# Patient Record
Sex: Male | Born: 1945
Health system: Southern US, Community
[De-identification: ages and names within clinical notes are randomized; demographics above are authoritative.]

## PROBLEM LIST (undated history)

## (undated) DIAGNOSIS — I4892 Unspecified atrial flutter: Secondary | ICD-10-CM

## (undated) DIAGNOSIS — I4891 Unspecified atrial fibrillation: Secondary | ICD-10-CM

## (undated) DIAGNOSIS — K219 Gastro-esophageal reflux disease without esophagitis: Secondary | ICD-10-CM

## (undated) DIAGNOSIS — M519 Unspecified thoracic, thoracolumbar and lumbosacral intervertebral disc disorder: Secondary | ICD-10-CM

## (undated) DIAGNOSIS — R55 Syncope and collapse: Secondary | ICD-10-CM

## (undated) DIAGNOSIS — M1990 Unspecified osteoarthritis, unspecified site: Secondary | ICD-10-CM

## (undated) DIAGNOSIS — C61 Malignant neoplasm of prostate: Secondary | ICD-10-CM

## (undated) DIAGNOSIS — C7951 Secondary malignant neoplasm of bone: Secondary | ICD-10-CM

## (undated) DIAGNOSIS — I499 Cardiac arrhythmia, unspecified: Secondary | ICD-10-CM

## (undated) HISTORY — PX: LUMBAR LAMINECTOMY: SHX95

## (undated) HISTORY — DX: Unspecified osteoarthritis, unspecified site: M19.90

## (undated) HISTORY — PX: LAPAROSCOPIC CHOLECYSTECTOMY: SUR755

## (undated) HISTORY — PX: APPENDECTOMY: SHX54

## (undated) HISTORY — PX: KNEE SURGERY: SHX244

## (undated) HISTORY — DX: Malignant neoplasm of prostate: C61

## (undated) HISTORY — DX: Secondary malignant neoplasm of bone: C79.51

## (undated) HISTORY — PX: BACK SURGERY: SHX140

## (undated) HISTORY — DX: Unspecified atrial flutter: I48.92

## (undated) HISTORY — DX: Unspecified atrial fibrillation: I48.91

---

## 1998-09-03 ENCOUNTER — Ambulatory Visit (HOSPITAL_COMMUNITY): Admission: RE | Admit: 1998-09-03 | Discharge: 1998-09-03 | Payer: Self-pay | Admitting: Orthopedic Surgery

## 1999-06-23 ENCOUNTER — Ambulatory Visit (HOSPITAL_COMMUNITY): Admission: RE | Admit: 1999-06-23 | Discharge: 1999-06-23 | Payer: Self-pay | Admitting: Gastroenterology

## 2000-04-18 ENCOUNTER — Encounter: Payer: Self-pay | Admitting: Emergency Medicine

## 2000-04-18 ENCOUNTER — Emergency Department (HOSPITAL_COMMUNITY): Admission: EM | Admit: 2000-04-18 | Discharge: 2000-04-18 | Payer: Self-pay | Admitting: Emergency Medicine

## 2000-04-19 ENCOUNTER — Encounter: Payer: Self-pay | Admitting: Emergency Medicine

## 2000-04-19 ENCOUNTER — Encounter: Payer: Self-pay | Admitting: Surgery

## 2000-04-19 ENCOUNTER — Observation Stay: Admission: AD | Admit: 2000-04-19 | Discharge: 2000-04-20 | Payer: Self-pay | Admitting: Surgery

## 2002-06-10 ENCOUNTER — Ambulatory Visit (HOSPITAL_COMMUNITY): Admission: RE | Admit: 2002-06-10 | Discharge: 2002-06-10 | Payer: Self-pay | Admitting: Gastroenterology

## 2003-05-09 ENCOUNTER — Encounter: Admission: RE | Admit: 2003-05-09 | Discharge: 2003-05-09 | Payer: Self-pay | Admitting: Neurology

## 2004-10-27 ENCOUNTER — Encounter: Admission: RE | Admit: 2004-10-27 | Discharge: 2004-10-27 | Payer: Self-pay | Admitting: Family Medicine

## 2004-12-27 ENCOUNTER — Ambulatory Visit (HOSPITAL_COMMUNITY): Admission: RE | Admit: 2004-12-27 | Discharge: 2004-12-27 | Payer: Self-pay | Admitting: General Surgery

## 2004-12-27 ENCOUNTER — Ambulatory Visit (HOSPITAL_BASED_OUTPATIENT_CLINIC_OR_DEPARTMENT_OTHER): Admission: RE | Admit: 2004-12-27 | Discharge: 2004-12-27 | Payer: Self-pay | Admitting: General Surgery

## 2010-07-04 DIAGNOSIS — R55 Syncope and collapse: Secondary | ICD-10-CM

## 2010-07-04 HISTORY — DX: Syncope and collapse: R55

## 2010-09-07 ENCOUNTER — Emergency Department (HOSPITAL_COMMUNITY)
Admission: EM | Admit: 2010-09-07 | Discharge: 2010-09-07 | Disposition: A | Discharge status: Home / Self Care | Payer: BC Managed Care – PPO | Attending: Emergency Medicine | Admitting: Emergency Medicine

## 2010-09-07 DIAGNOSIS — H538 Other visual disturbances: Secondary | ICD-10-CM | POA: Insufficient documentation

## 2010-09-07 DIAGNOSIS — H3320 Serous retinal detachment, unspecified eye: Secondary | ICD-10-CM | POA: Insufficient documentation

## 2010-09-07 DIAGNOSIS — H4310 Vitreous hemorrhage, unspecified eye: Secondary | ICD-10-CM | POA: Insufficient documentation

## 2010-09-07 DIAGNOSIS — H532 Diplopia: Secondary | ICD-10-CM | POA: Insufficient documentation

## 2011-08-30 DIAGNOSIS — Z85820 Personal history of malignant melanoma of skin: Secondary | ICD-10-CM | POA: Diagnosis not present

## 2011-08-30 DIAGNOSIS — L570 Actinic keratosis: Secondary | ICD-10-CM | POA: Diagnosis not present

## 2011-08-30 DIAGNOSIS — D235 Other benign neoplasm of skin of trunk: Secondary | ICD-10-CM | POA: Diagnosis not present

## 2011-09-02 DIAGNOSIS — I4892 Unspecified atrial flutter: Secondary | ICD-10-CM

## 2011-09-02 HISTORY — DX: Unspecified atrial flutter: I48.92

## 2011-09-21 ENCOUNTER — Encounter (HOSPITAL_COMMUNITY): Payer: Self-pay | Admitting: *Deleted

## 2011-09-21 ENCOUNTER — Inpatient Hospital Stay (HOSPITAL_COMMUNITY)
Admission: EM | Admit: 2011-09-21 | Discharge: 2011-09-22 | DRG: 310 | Disposition: A | Discharge status: Home / Self Care | Payer: Medicare Other | Attending: Cardiology | Admitting: Cardiology

## 2011-09-21 ENCOUNTER — Emergency Department (HOSPITAL_COMMUNITY): Payer: Medicare Other

## 2011-09-21 DIAGNOSIS — Z7982 Long term (current) use of aspirin: Secondary | ICD-10-CM

## 2011-09-21 DIAGNOSIS — R351 Nocturia: Secondary | ICD-10-CM | POA: Diagnosis present

## 2011-09-21 DIAGNOSIS — M47817 Spondylosis without myelopathy or radiculopathy, lumbosacral region: Secondary | ICD-10-CM | POA: Diagnosis present

## 2011-09-21 DIAGNOSIS — Z6831 Body mass index (BMI) 31.0-31.9, adult: Secondary | ICD-10-CM | POA: Diagnosis not present

## 2011-09-21 DIAGNOSIS — R3911 Hesitancy of micturition: Secondary | ICD-10-CM | POA: Diagnosis present

## 2011-09-21 DIAGNOSIS — R0602 Shortness of breath: Secondary | ICD-10-CM | POA: Diagnosis not present

## 2011-09-21 DIAGNOSIS — Z79899 Other long term (current) drug therapy: Secondary | ICD-10-CM | POA: Diagnosis not present

## 2011-09-21 DIAGNOSIS — R000 Tachycardia, unspecified: Secondary | ICD-10-CM | POA: Diagnosis not present

## 2011-09-21 DIAGNOSIS — M1710 Unilateral primary osteoarthritis, unspecified knee: Secondary | ICD-10-CM | POA: Diagnosis present

## 2011-09-21 DIAGNOSIS — I441 Atrioventricular block, second degree: Secondary | ICD-10-CM | POA: Diagnosis present

## 2011-09-21 DIAGNOSIS — M519 Unspecified thoracic, thoracolumbar and lumbosacral intervertebral disc disorder: Secondary | ICD-10-CM | POA: Insufficient documentation

## 2011-09-21 DIAGNOSIS — R55 Syncope and collapse: Secondary | ICD-10-CM | POA: Insufficient documentation

## 2011-09-21 DIAGNOSIS — I4892 Unspecified atrial flutter: Principal | ICD-10-CM | POA: Diagnosis present

## 2011-09-21 DIAGNOSIS — R002 Palpitations: Secondary | ICD-10-CM | POA: Diagnosis not present

## 2011-09-21 HISTORY — DX: Unspecified thoracic, thoracolumbar and lumbosacral intervertebral disc disorder: M51.9

## 2011-09-21 HISTORY — DX: Syncope and collapse: R55

## 2011-09-21 LAB — CBC
HCT: 48.5 % (ref 39.0–52.0)
MCV: 92.4 fL (ref 78.0–100.0)
Platelets: 202 10*3/uL (ref 150–400)
RDW: 13.8 % (ref 11.5–15.5)
RDW: 13.8 % (ref 11.5–15.5)
WBC: 7.5 10*3/uL (ref 4.0–10.5)
WBC: 6.8 10*3/uL (ref 4.0–10.5)

## 2011-09-21 LAB — TSH: TSH: 1.158 u[IU]/mL (ref 0.350–4.500)

## 2011-09-21 LAB — COMPREHENSIVE METABOLIC PANEL
BUN: 21 mg/dL (ref 6–23)
CO2: 23 mEq/L (ref 19–32)
Chloride: 109 mEq/L (ref 96–112)
Creatinine, Ser: 0.92 mg/dL (ref 0.50–1.35)
GFR calc Af Amer: >90 mL/min (ref >90)
GFR calc non Af Amer: 85 mL/min — ABNORMAL LOW (ref >90)
Total Bilirubin: 0.6 mg/dL (ref 0.3–1.2)

## 2011-09-21 LAB — DIFFERENTIAL
Basophils Absolute: 0 10*3/uL (ref 0.0–0.1)
Eosinophils Relative: 3 % (ref 0–5)
Lymphs Abs: 1.9 10*3/uL (ref 0.7–4.0)
Monocytes Relative: 7 % (ref 3–12)
Neutro Abs: 4.9 10*3/uL (ref 1.7–7.7)

## 2011-09-21 LAB — CARDIAC PANEL(CRET KIN+CKTOT+MB+TROPI)
CK, MB: 4.1 ng/mL — ABNORMAL HIGH (ref 0.3–4.0)
Relative Index: 4 — ABNORMAL HIGH (ref 0.0–2.5)
Total CK: 103 U/L (ref 7–232)
Troponin I: <0.3 ng/mL

## 2011-09-21 LAB — TROPONIN I: Troponin I: <0.3 ng/mL (ref <0.30)

## 2011-09-21 LAB — CREATININE, SERUM
GFR calc Af Amer: >90 mL/min (ref >90)
GFR calc non Af Amer: 83 mL/min — ABNORMAL LOW (ref >90)

## 2011-09-21 LAB — PROTIME-INR: Prothrombin Time: 15 seconds (ref 11.6–15.2)

## 2011-09-21 LAB — APTT: aPTT: 29 seconds (ref 24–37)

## 2011-09-21 MED ORDER — SODIUM CHLORIDE 0.9 % IV BOLUS (SEPSIS)
1000.0000 mL | Freq: Once | INTRAVENOUS | Status: AC
  Administered 2011-09-21: 1000 mL via INTRAVENOUS

## 2011-09-21 MED ORDER — ACETAMINOPHEN 325 MG PO TABS: 650.0000 mg | ORAL_TABLET | ORAL | Status: DC | PRN

## 2011-09-21 MED ORDER — ENOXAPARIN SODIUM 40 MG/0.4ML ~~LOC~~ SOLN
40.0000 mg | SUBCUTANEOUS | Status: DC
  Filled 2011-09-21 (×2): qty 0.4

## 2011-09-21 MED ORDER — DILTIAZEM HCL ER COATED BEADS 120 MG PO CP24
120.0000 mg | ORAL_CAPSULE | Freq: Every day | ORAL | Status: DC
  Administered 2011-09-21: 120 mg via ORAL
  Filled 2011-09-21 (×2): qty 1

## 2011-09-21 MED ORDER — DILTIAZEM HCL 50 MG/10ML IV SOLN
10.0000 mg | Freq: Once | INTRAVENOUS | Status: AC
  Administered 2011-09-21: 10 mg via INTRAVENOUS

## 2011-09-21 MED ORDER — ADENOSINE 6 MG/2ML IV SOLN
6.0000 mg | Freq: Once | INTRAVENOUS | Status: AC
  Administered 2011-09-21: 6 mg via INTRAVENOUS
  Filled 2011-09-21: qty 4
  Filled 2011-09-21: qty 2

## 2011-09-21 MED ORDER — ONDANSETRON HCL 4 MG/2ML IJ SOLN: 4.0000 mg | Freq: Four times a day (QID) | INTRAMUSCULAR | Status: DC | PRN

## 2011-09-21 MED ORDER — ASPIRIN 81 MG PO CHEW
81.0000 mg | CHEWABLE_TABLET | Freq: Every day | ORAL | Status: DC
  Administered 2011-09-21 – 2011-09-22 (×2): 81 mg via ORAL
  Filled 2011-09-21 (×2): qty 1

## 2011-09-21 MED ORDER — DILTIAZEM HCL ER 60 MG PO CP12: 120.0000 mg | ORAL_CAPSULE | Freq: Two times a day (BID) | ORAL | Status: DC

## 2011-09-21 MED ORDER — DEXTROSE 5 % IV SOLN
5.0000 mg/h | INTRAVENOUS | Status: DC
  Administered 2011-09-21: 5 mg/h via INTRAVENOUS

## 2011-09-21 MED ORDER — DILTIAZEM HCL ER 60 MG PO CP12
120.0000 mg | ORAL_CAPSULE | Freq: Two times a day (BID) | ORAL | Status: DC
  Filled 2011-09-21: qty 2

## 2011-09-21 NOTE — ED Notes (Signed)
Dr. Jeraldine Loots was made aware of the patient's HR

## 2011-09-21 NOTE — ED Notes (Signed)
Pt received to [facility] with c/o tachycardia, palpitations x 1 week but dizziness. Pt denies any chest pain, no shortness of breath. -

## 2011-09-21 NOTE — ED Notes (Addendum)
Sent pov from pcp for eval of svt. Pt stable, denies complaint. ecg sent with pt reveals svt. Pulses palpable and rapid.

## 2011-09-21 NOTE — ED Notes (Addendum)
Pt states for approx the last week he has felt like his heart has been pumping too hard. Pt went to doctors office and was noted to have heart rate in the 150s. Pt states he feels okay, states that he just feels like his heart is beating to fast. Cardiologist is dr Donnie Aho, is aware of pt would like to be paged.

## 2011-09-21 NOTE — ED Notes (Signed)
Cardizem gtt was increased to 15 mg/hr, HR 150's. Pt denies any chest pain nor shortness of breath nor dizziness. Will monitor.

## 2011-09-21 NOTE — ED Notes (Signed)
Pt is NAD, SR in the monitor, HR= 60's any chest pain, no SOB, no dizziness.

## 2011-09-21 NOTE — H&P (Signed)
Admit date: 09/21/2011 Name:  Rosalie Gelpi Medical record number: [id number] DOB/Age:  April 01, 1946  66 y.o.  Referring Physician:  Dr. Hall Busing  Primary Cardiologist:  Dr. Viann Fish  Chief complaint/reason for admission:  Rapid heartbeat  HPI:  This very nice 66 year old male presented to his local physician's office for a physical examination today. He was found to be in a rapid supraventricular tachycardia and his physician consulted me over the telephone. At the time I asked him to go to the emergency room because of thought he might need an intravenous injection. He was initially evaluated in the emergency room with adenosine and later placed in a diltiazem drip and converted to sinus rhythm. He was sent to the floor and I was notified after the patient arrived on the floor that he was in the hospital. He complained of his heart has been beating fast for about a week but had been able to exercise and do normal activities without symptoms. He denied angina and had no PND, orthopnea, or edema.  About a year ago he had an episode of syncope that occurred associated with low blood pressure but had a normal pulse during the syncopal episode. He had a workup done that was unremarkable and wore an event monitor that really never showed much. Several years ago he was on a beta blocker for headaches but this was later discontinued.   Past Medical History  Diagnosis Date  . Syncope 2012    negative workup  . Lumbar disc disease   . Atrial flutter with rapid ventricular response 3 /13    Past Surgical History  Procedure Date  . Laparoscopic cholecystectomy   . Lumbar laminectomy   . Knee surgery   . Appendectomy   .  Allergies:  has no known allergies.   Medications: No medicines normally at home  Family History:    Family Status  Relation Status Death Age  . Father Deceased 107    died of alcoholism  . Mother Deceased 69  . Brother Alive   . Cousin Alive    history of ablation for arrythmia     Social History:  Nonsmoker, he drinks occasional beer or glass of wine. No history of illicit drugs.   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     Review of Systems:  He lost 30 pounds last year gained tablet back for a net loss of about 20 pounds. This is voluntary. He reports a recent visit to the dermatologist. He has chronic arthritis involving his knees and his lumbar spine. There is a history of gastrointestinal bleeding. He has mild nocturia and mild hesitancy. There is no history of stroke or TIAs.  Other than as noted above, the remainder of the review of systems is normal  Physical Exam: BP 112/81  Pulse 92  Temp(Src) 98.2 F (36.8 C) (Oral)  Resp 20  Ht 6\' 1"  (1.854 m)  Wt 108.863 kg (240 lb)  BMI 31.66 kg/m2  SpO2 96% General appearance: alert and no distress Head: Normocephalic, without obvious abnormality, atraumatic Eyes: Conjunctiva and sclera clear, EOMI, PERRLA, fundi not examined Throat: lips, mucosa, and tongue normal; teeth and gums normal Neck: no adenopathy, no carotid bruit, no JVD and supple, symmetrical, trachea midline Lungs: clear to auscultation bilaterally Heart: regular rate and rhythm, S1, S2 normal, no murmur, click, rub or gallop Abdomen: soft, non-tender; bowel sounds normal; no masses,  no organomegaly Rectal: deferred Extremities: extremities  normal, atraumatic, no cyanosis or edema Pulses: 2+ and symmetric Skin: Skin color, texture, turgor normal. No rashes or lesions  Labs: Results for orders placed during the hospital encounter of 09/21/11 (from the past 24 hour(s))  CBC     Status: Normal   Collection Time   09/21/11 11:06 AM      Component Value Range   WBC 7.5  4.0 - 10.5 (K/uL)   RBC 5.25  4.22 - 5.81 (MIL/uL)   Hemoglobin 16.2  13.0 - 17.0 (g/dL)   HCT 16.1  09.6 - 04.5 (%)   MCV 92.4  78.0 - 100.0 (fL)   MCH 30.9  26.0 - 34.0 (pg)    MCHC 33.4  30.0 - 36.0 (g/dL)   RDW 40.9  81.1 - 91.4 (%)   Platelets 209  150 - 400 (K/uL)  DIFFERENTIAL     Status: Normal   Collection Time   09/21/11 11:06 AM      Component Value Range   Neutrophils Relative 65  43 - 77 (%)   Lymphocytes Relative 25  12 - 46 (%)   Monocytes Relative 7  3 - 12 (%)   Eosinophils Relative 3  0 - 5 (%)   Basophils Relative 0  0 - 1 (%)   Neutro Abs 4.9  1.7 - 7.7 (K/uL)   Lymphs Abs 1.9  0.7 - 4.0 (K/uL)   Monocytes Absolute 0.5  0.1 - 1.0 (K/uL)   Eosinophils Absolute 0.2  0.0 - 0.7 (K/uL)   Basophils Absolute 0.0  0.0 - 0.1 (K/uL)   WBC Morphology ATYPICAL LYMPHOCYTES    COMPREHENSIVE METABOLIC PANEL     Status: Abnormal   Collection Time   09/21/11 11:06 AM      Component Value Range   Sodium 142  135 - 145 (mEq/L)   Potassium 4.2  3.5 - 5.1 (mEq/L)   Chloride 109  96 - 112 (mEq/L)   CO2 23  19 - 32 (mEq/L)   Glucose, Bld 99  70 - 99 (mg/dL)   BUN 21  6 - 23 (mg/dL)   Creatinine, Ser 7.82  0.50 - 1.35 (mg/dL)   Calcium 9.3  8.4 - 95.6 (mg/dL)   Total Protein 6.6  6.0 - 8.3 (g/dL)   Albumin 4.1  3.5 - 5.2 (g/dL)   AST 24  0 - 37 (U/L)   ALT 16  0 - 53 (U/L)   Alkaline Phosphatase 53  39 - 117 (U/L)   Total Bilirubin 0.6  0.3 - 1.2 (mg/dL)   GFR calc non Af Amer 85 (*) >90 (mL/min)   GFR calc Af Amer >90  >90 (mL/min)  TROPONIN I     Status: Normal   Collection Time   09/21/11 11:06 AM      Component Value Range   Troponin I <0.30  <0.30 (ng/mL)    EKG: Hospital EKG not currently available for review however EKG faxed from doctor's office showed atrial flutter with 21 block  Radiology: Low lung volumes   IMPRESSIONS:  1. Atrial flutter-converted to sinus rhythm  [place] score 0  2. History of syncope 3. History lumbar disc disease  PLAN: Oral diltiazem and discontinue intravenous diltiazem. Aspirin for anticoagulation. Obtain echocardiogram and TSH. Hopefully discharge her early if remains in sinus rhythm and enzymes  negative.   Signed: Darden Palmer MD [facility]  09/21/2011, 5:02 PM

## 2011-09-21 NOTE — ED Provider Notes (Signed)
History     CSN: [id number]  Arrival date & time 09/21/11  1019   First MD Initiated Contact with Patient 09/21/11 1031      Chief Complaint  Patient presents with  . Palpitations    HPI Wyatt Duran is a 66 y/o M with no pertinent PMH who was referred from his PCP from tachycardia and chest palpitations. EKG at outside hospital was consistent with SVT vs/ aflutter.  He states he's been "feeling his heart beat" for the past week. Prior to this week, he's never had this before. He denies CP, SOB, sweating, abd pain, nausea, vomiting, or any extremity swelling. He does endorse increased fatigue this week. However, pt states that he feels well and is doing all of the things he normally does including golfing and running with the dog.  History reviewed. No pertinent past medical history.  Past Surgical History  Procedure Date  . Cholecystectomy     History reviewed. No pertinent family history.  History  Substance Use Topics  . Smoking status: Never Smoker   . Smokeless tobacco: Not on file  . Alcohol Use: Yes      Review of Systems  Constitutional:       Per HPI, otherwise negative  HENT:       Per HPI, otherwise negative  Eyes: Negative.   Respiratory:       Per HPI, otherwise negative  Cardiovascular:       Per HPI, otherwise negative  Gastrointestinal: Negative for vomiting.  Genitourinary: Negative.   Musculoskeletal:       Per HPI, otherwise negative  Skin: Negative.   Neurological: Negative for syncope.    Allergies  Review of patient's allergies indicates not on file.  Home Medications  No current outpatient prescriptions on file.  BP 121/87  Pulse 160  Temp(Src) 98.5 F (36.9 C) (Oral)  Resp 28  SpO2 99%  Physical Exam  Nursing note and vitals reviewed. Constitutional: He is oriented to person, place, and time. He appears well-developed. No distress.  HENT:  Head: Normocephalic and atraumatic.  Eyes: Conjunctivae and EOM are normal.    Cardiovascular: Regular rhythm.  Tachycardia present.   Pulmonary/Chest: Effort normal. No stridor. No respiratory distress.  Abdominal: He exhibits no distension.  Musculoskeletal: He exhibits no edema.  Neurological: He is alert and oriented to person, place, and time.  Skin: Skin is warm and dry.  Psychiatric: He has a normal mood and affect.    ED Course  Procedures (including critical care time)   Labs Reviewed  CBC  DIFFERENTIAL  COMPREHENSIVE METABOLIC PANEL  TROPONIN I   No results found.   No diagnosis found.  Pulse oximetry 100% room air  Cardiac monitor 71 sinus rhythm normal X-ray reviewed by me    Date: 09/21/2011 - on arrival  Rate: 165  Rhythm: atrial flutter  QRS Axis: normal  Intervals: indet  ST/T Wave abnormalities: nonspecific ST/T changes  Conduction Disutrbances:none  Narrative Interpretation:   Old EKG Reviewed: none available ABNORMAL  Repeat rhythm check as above (following conversion)   MDM  This generally well-appearing male presents with palpitations, on referral from his primary care doctor.  On initial exam the patient is in no distress, though his vital signs are notable for tachycardia and relatively low blood pressure (BP 80s/70s).  The patient's ECG was most consistent with a flutter or svt.  Given the patient's disruption of palpitations concern for ongoing tachycardia, the patient initially had an adenosine bolus, which  unmasked atrial evidence of flutter.  The patient tolerated this well.  The following that revelation, the patient was started on a calcium channel blocker drip.  The patient remained tachycardic for some time, but eventually his heart rate declined, and he began to have intermittent periods of sinus rhythm with a normal rate.  I discussed the patient's case with the cardiology service.  Patient was admitted to their service for further evaluation and management.  CRITICAL CARE Performed by: Gerhard Munch   Total critical care time: 35  Critical care time was exclusive of separately billable procedures and treating other patients.  Critical care was necessary to treat or prevent imminent or life-threatening deterioration.  Critical care was time spent personally by me on the following activities: development of treatment plan with patient and/or surrogate as well as nursing, discussions with consultants, evaluation of patient's response to treatment, examination of patient, obtaining history from patient or surrogate, ordering and performing treatments and interventions, ordering and review of laboratory studies, ordering and review of radiographic studies, pulse oximetry and re-evaluation of patient's condition.         Gerhard Munch, MD 09/22/11 [phone]

## 2011-09-21 NOTE — ED Notes (Signed)
Cardizem gtt was increased to 10 mg/hr, HR =150's , pt asymptomatic at present. Will continue to monitor

## 2011-09-21 NOTE — ED Notes (Signed)
HR 60's , Cardizem drip was decreased to 5 mg/hr. VSS. Pt denies any chest pain, SOB. No dizziness. Will continue to monitor

## 2011-09-22 ENCOUNTER — Other Ambulatory Visit: Payer: Self-pay

## 2011-09-22 DIAGNOSIS — M1710 Unilateral primary osteoarthritis, unspecified knee: Secondary | ICD-10-CM | POA: Diagnosis not present

## 2011-09-22 DIAGNOSIS — I4892 Unspecified atrial flutter: Secondary | ICD-10-CM | POA: Diagnosis not present

## 2011-09-22 DIAGNOSIS — R351 Nocturia: Secondary | ICD-10-CM | POA: Diagnosis not present

## 2011-09-22 DIAGNOSIS — M47817 Spondylosis without myelopathy or radiculopathy, lumbosacral region: Secondary | ICD-10-CM | POA: Diagnosis not present

## 2011-09-22 LAB — CARDIAC PANEL(CRET KIN+CKTOT+MB+TROPI)
Relative Index: INVALID (ref 0.0–2.5)
Troponin I: <0.3 ng/mL (ref <0.30)

## 2011-09-22 MED ORDER — DILTIAZEM HCL ER COATED BEADS 240 MG PO CP24: 240.0000 mg | ORAL_CAPSULE | Freq: Every day | ORAL | Status: DC

## 2011-09-22 MED ORDER — ASPIRIN 81 MG PO CHEW: 81.0000 mg | CHEWABLE_TABLET | Freq: Every day | ORAL | Status: DC

## 2011-09-22 MED ORDER — DILTIAZEM HCL ER COATED BEADS 120 MG PO CP24: 240.0000 mg | ORAL_CAPSULE | Freq: Every day | ORAL | Status: DC

## 2011-09-22 NOTE — Discharge Summary (Signed)
Physician Discharge Summary  Patient ID: Wyatt Duran MRN: [id number] DOB/AGE: [birth year] 66 y.o.  Admit date: 09/21/2011 Discharge date: 09/22/2011  Primary Physician:  Dr. Hall Busing  Primary Discharge Diagnosis: 1. Atrial flutter with rapid response-resolved  Secondary Discharge Diagnosis: 2. History lumbar disease 3. History of syncope  Procedures::  Echocardiogram  Hospital Course: This 66 year old male presented to his primary physician's office for a physical and was found to have had tachycardia for one week. He was noted to be in a rapid narrow complex rhythm suggestive of atrial flutter and after conversation was sent to the emergency room because of is thought he might need an intravenous injection. While in the emergency room he was received adenosine and later a diltiazem drip. He converted to sinus rhythm in the emergency room and was admitted for observation overnight.  He remained in normal sinus rhythm overnight and was changed to oral diltiazem. An echocardiogram was done the next morning. TSH was normal and he was discharged home on aspirin and diltiazem. His [place] score is is 0 so it is thought that aspirin will be adequate for anticoagulation.  Discharge Exam: Blood pressure 95/54, pulse 65, temperature 97.5 F (36.4 C), temperature source Oral, resp. rate 18, height 6\' 1"  (1.854 m), weight 108.863 kg (240 lb), SpO2 96.00%.   Lungs clear, no S3  Labs: CBC:   Lab Results  Component Value Date   WBC 6.8 09/21/2011   HGB 14.4 09/21/2011   HCT 44.6 09/21/2011   MCV 91.8 09/21/2011   PLT 202 09/21/2011   CMP:  Lab 09/21/11 1715 09/21/11 1106  NA -- 142  K -- 4.2  CL -- 109  CO2 -- 23  BUN -- 21  CREATININE 0.98 --  CALCIUM -- 9.3  PROT -- 6.6  BILITOT -- 0.6  ALKPHOS -- 53  ALT -- 16  AST -- 24  GLUCOSE -- 99   Lipid Panel  No results found for this basename: chol, trig, hdl, cholhdl, vldl, ldlcalc   Cardiac Enzymes:  Basename 09/21/11  2310 09/21/11 1714 09/21/11 1106  CKTOTAL 89 103 --  CKMB 3.3 4.1* --  CKMBINDEX -- -- --  TROPONINI <0.30 <0.30 <0.30   TSH: 1.158   Radiology: Low lung volumes  EKG: Normal sinus rhythm, incomplete right bundle branch block  Discharge Medications:  Wyatt Duran, Wyatt Duran  Home Medication Instructions [id number]   Printed on:09/22/11 0930  Medication Information                    aspirin 81 MG chewable tablet Chew 1 tablet (81 mg total) by mouth daily.           diltiazem (CARDIZEM CD) 120 MG 24 hr capsule Take 2 capsules (240 mg total) by mouth daily after supper.             Followup plans and appointments: Followup with Dr. Donnie Duran in one week. We will have him wear an outpatient event monitor. He should followup with his regular doctor for a physical.   Signed: W. Wyatt Royalty MD [facility]  09/22/2011, 9:30 AM

## 2011-09-22 NOTE — Progress Notes (Signed)
  Echocardiogram 2D Echocardiogram has been performed.  [name] [facility] 09/22/2011, 9:46 AM

## 2011-09-27 DIAGNOSIS — I4892 Unspecified atrial flutter: Secondary | ICD-10-CM | POA: Diagnosis not present

## 2011-09-27 DIAGNOSIS — E669 Obesity, unspecified: Secondary | ICD-10-CM | POA: Diagnosis not present

## 2011-09-27 DIAGNOSIS — K219 Gastro-esophageal reflux disease without esophagitis: Secondary | ICD-10-CM | POA: Diagnosis not present

## 2011-09-28 ENCOUNTER — Encounter: Payer: Self-pay | Admitting: Internal Medicine

## 2011-09-28 DIAGNOSIS — K219 Gastro-esophageal reflux disease without esophagitis: Secondary | ICD-10-CM | POA: Diagnosis not present

## 2011-09-28 DIAGNOSIS — E669 Obesity, unspecified: Secondary | ICD-10-CM | POA: Diagnosis not present

## 2011-09-28 DIAGNOSIS — I4892 Unspecified atrial flutter: Secondary | ICD-10-CM | POA: Diagnosis not present

## 2011-10-11 ENCOUNTER — Encounter: Payer: Self-pay | Admitting: Internal Medicine

## 2011-10-17 DIAGNOSIS — E669 Obesity, unspecified: Secondary | ICD-10-CM | POA: Diagnosis not present

## 2011-10-17 DIAGNOSIS — I4892 Unspecified atrial flutter: Secondary | ICD-10-CM | POA: Diagnosis not present

## 2011-10-17 DIAGNOSIS — K219 Gastro-esophageal reflux disease without esophagitis: Secondary | ICD-10-CM | POA: Diagnosis not present

## 2011-10-28 ENCOUNTER — Encounter: Payer: Self-pay | Admitting: Internal Medicine

## 2011-10-28 ENCOUNTER — Ambulatory Visit (INDEPENDENT_AMBULATORY_CARE_PROVIDER_SITE_OTHER): Payer: Medicare Other | Admitting: Internal Medicine

## 2011-10-28 VITALS — BP 113/79 | HR 60 | Resp 18 | Ht 73.0 in | Wt 239.1 lb

## 2011-10-28 DIAGNOSIS — I495 Sick sinus syndrome: Secondary | ICD-10-CM

## 2011-10-28 DIAGNOSIS — R55 Syncope and collapse: Secondary | ICD-10-CM | POA: Diagnosis not present

## 2011-10-28 DIAGNOSIS — I4891 Unspecified atrial fibrillation: Secondary | ICD-10-CM

## 2011-10-28 DIAGNOSIS — I4892 Unspecified atrial flutter: Secondary | ICD-10-CM | POA: Diagnosis not present

## 2011-10-28 MED ORDER — RIVAROXABAN 20 MG PO TABS: 20.0000 mg | ORAL_TABLET | Freq: Every day | ORAL | Status: DC

## 2011-10-28 NOTE — Patient Instructions (Signed)
Your physician recommends that you schedule a follow-up appointment in: 3 weeks with Dr Johney Frame  Your physician has recommended you make the following change in your medication:  1) Start Xarelto 20mg  daily 2) Stop Aspirin

## 2011-10-28 NOTE — Progress Notes (Signed)
Primary Care Physician: Elie Confer, MD, MD Referring Physician: Dr [name] is a 66 y.o. male with a h/o recently discovered atrial fibrillation and atrial flutter who presents for EP consultation.  He reports initially being diagnosed with atrial fibrillation 3/13 after presenting to Dr Marya Amsler office for routine physical.  He reports in retrospect that he has had tachypalpitations intermittently for at least a year.  He was admitted to [facility] where he had atrial flutter with 2:1 conduction.  He converted to sinus rhythm with diltiazem.   He returned to Dr York Spaniel office the following week and was found to have atrial fibrillation at that time.  He was started on diltiazem 240mg  daily at that time.  He had an event monitor placed which revealed elevated ventricular rates.  His cardizem was increased to  360mg  daily.  He was then observed to have both episodes of tachycardia as well as bradycardia with pauses of up to 4 seconds observed.  He is now referred for EP consultation.  Presently, the patient states that he feels "fine".  He did 5 hours of yard work yesterday.  He walks 1.5 hours per day and plays golf frequently.  He denies dizziness or lightheadedness.   He did have a syncopal spell 09/2010.  He was a meeting in [place] after having worked out in his yard for several hours before.  He felt weak and diaphoretic in the crowd and went to get something to drink.  Upon standing, he collapsed.  He has had no other episodes of presyncope or syncope.    Past Medical History  Diagnosis Date  . Syncope 2012    negative workup  . Lumbar disc disease   . Atrial flutter 3 /13  . Atrial fibrillation   . DJD (degenerative joint disease)    Past Surgical History  Procedure Date  . Laparoscopic cholecystectomy   . Lumbar laminectomy   . Knee surgery   . Appendectomy   . Back surgery     Current Outpatient Prescriptions  Medication Sig Dispense Refill  .  aspirin 81 MG chewable tablet Chew 1 tablet (81 mg total) by mouth daily.  1 tablet  30  . diltiazem (CARDIZEM CD) 240 MG 24 hr capsule Take 360 mg by mouth daily.      Marland Kitchen TAZTIA XT 360 MG 24 hr capsule       . DISCONTD: diltiazem (CARTIA XT) 240 MG 24 hr capsule Take 1 capsule (240 mg total) by mouth daily.  30 capsule  12    No Known Allergies  History   Social History  . Marital Status: Married    Spouse Name: N/A    Number of Children: N/A  . Years of Education: N/A   Occupational History  . Not on file.   Social History Main Topics  . Smoking status: Never Smoker   . Smokeless tobacco: Never Used  . Alcohol Use: Yes     couple of glasses of wine per week  . Drug Use: No  . Sexually Active: Not on file   Other Topics Concern  . Not on file   Social History Narrative   Pt lives in [place].  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     Family History  Problem Relation Age of Onset  . Coronary artery disease Father 70    h/o heavy ETOH    ROS- All systems are reviewed and negative except as per the HPI above  Physical  Exam: Filed Vitals:   10/28/11 1128  BP: 113/79  Pulse: 60  Resp: 18  Height: 6\' 1"  (1.854 m)  Weight: 239 lb 1.9 oz (108.464 kg)    GEN- The patient is well appearing, alert and oriented x 3 today.   Head- normocephalic, atraumatic Eyes-  Sclera clear, conjunctiva pink Ears- hearing intact Oropharynx- clear Neck- supple, no JVP Lymph- no cervical lymphadenopathy Lungs- Clear to ausculation bilaterally, normal work of breathing Heart- Regular rate and rhythm, no murmurs, rubs or gallops, PMI not laterally displaced GI- soft, NT, ND, + BS Extremities- no clubbing, cyanosis, or edema MS- no significant deformity or atrophy Skin- no rash or lesion Psych- euthymic mood, full affect Neuro- strength and sensation are intact  EKG today reveals afib, V rate 84 bpm, RBBB, nonspecific ST/t changes  Assessment and Plan:

## 2011-10-30 ENCOUNTER — Encounter: Payer: Self-pay | Admitting: Internal Medicine

## 2011-10-30 DIAGNOSIS — I495 Sick sinus syndrome: Secondary | ICD-10-CM | POA: Insufficient documentation

## 2011-10-30 DIAGNOSIS — I4819 Other persistent atrial fibrillation: Secondary | ICD-10-CM | POA: Insufficient documentation

## 2011-10-30 NOTE — Assessment & Plan Note (Signed)
As above.

## 2011-10-30 NOTE — Assessment & Plan Note (Signed)
He has had syncope in the past which is clearly vasovagal by history.  He has had no syncope with ventricular pauses to my knowldege.

## 2011-10-30 NOTE — Assessment & Plan Note (Signed)
The patient has persistent atrial fibrillation and typical appearing atrial flutter.  He is minimally symptomatic with his afib but has ventricular rates which are quite elevated at times.  Rate control has been complicated by nocturnal pauses between 3 and 4 seconds in duration.  The patient has not been symptomatic with his pauses to this point. Therapeutic strategies for afib including rate control and rhythm control were discussed in detail with the patient today.  He is clear that he would like to avoid pacemaker implantation.  I therefore think that the most prudent strategy would be to try to achieve and maintain sinus rhythm.  This would hopefully alleviate the need for more aggressive rate control. I will initiate xarelto 20mg  daily today (he reports prior GERD and therefore I will not use pradaxa).  He will return in 3 weeks.  If he is tolerating anticoagulation at that time, then I will add flecainide 100mg  BID for rhythm control.  If he fails rhyhthm control with flecainide, then our options would be tikosyn, ablation, or pacemaker implantation with more aggressive rate control at that point. He is aware to contact my office immediately should he develop symptoms of presyncope/ syncope in the interim.

## 2011-11-02 DIAGNOSIS — E669 Obesity, unspecified: Secondary | ICD-10-CM | POA: Diagnosis not present

## 2011-11-02 DIAGNOSIS — Z131 Encounter for screening for diabetes mellitus: Secondary | ICD-10-CM | POA: Diagnosis not present

## 2011-11-02 DIAGNOSIS — Z136 Encounter for screening for cardiovascular disorders: Secondary | ICD-10-CM | POA: Diagnosis not present

## 2011-11-02 DIAGNOSIS — I4891 Unspecified atrial fibrillation: Secondary | ICD-10-CM | POA: Diagnosis not present

## 2011-11-02 DIAGNOSIS — Z0000 Encounter for general adult medical examination without abnormal findings: Secondary | ICD-10-CM | POA: Diagnosis not present

## 2011-11-08 NOTE — Progress Notes (Signed)
Addended by: Vista Mink D on: 11/08/2011 05:40 PM   Modules accepted: Orders

## 2011-11-14 ENCOUNTER — Encounter: Payer: Self-pay | Admitting: Internal Medicine

## 2011-11-14 ENCOUNTER — Ambulatory Visit (INDEPENDENT_AMBULATORY_CARE_PROVIDER_SITE_OTHER): Payer: Medicare Other | Admitting: Internal Medicine

## 2011-11-14 VITALS — BP 130/78 | HR 70 | Resp 18 | Ht 73.0 in | Wt 244.1 lb

## 2011-11-14 DIAGNOSIS — I495 Sick sinus syndrome: Secondary | ICD-10-CM | POA: Diagnosis not present

## 2011-11-14 DIAGNOSIS — I4891 Unspecified atrial fibrillation: Secondary | ICD-10-CM | POA: Diagnosis not present

## 2011-11-14 DIAGNOSIS — I4892 Unspecified atrial flutter: Secondary | ICD-10-CM

## 2011-11-14 DIAGNOSIS — T50904A Poisoning by unspecified drugs, medicaments and biological substances, undetermined, initial encounter: Secondary | ICD-10-CM | POA: Diagnosis not present

## 2011-11-14 LAB — BASIC METABOLIC PANEL
CO2: 21 mEq/L (ref 19–32)
Chloride: 104 mEq/L (ref 96–112)
Creatinine, Ser: 1 mg/dL (ref 0.4–1.5)
Potassium: 4.1 mEq/L (ref 3.5–5.1)
Sodium: 139 mEq/L (ref 135–145)

## 2011-11-14 LAB — CBC WITH DIFFERENTIAL/PLATELET
Basophils Relative: 0.3 % (ref 0.0–3.0)
Eosinophils Relative: 2.4 % (ref 0.0–5.0)
Hemoglobin: 14 g/dL (ref 13.0–17.0)
MCV: 91.4 fl (ref 78.0–100.0)
Monocytes Absolute: 0.5 10*3/uL (ref 0.1–1.0)
Neutro Abs: 6.3 10*3/uL (ref 1.4–7.7)
Neutrophils Relative %: 76.7 % (ref 43.0–77.0)
RBC: 4.68 Mil/uL (ref 4.22–5.81)
WBC: 8.2 10*3/uL (ref 4.5–10.5)

## 2011-11-14 MED ORDER — FLECAINIDE ACETATE 100 MG PO TABS: 100.0000 mg | ORAL_TABLET | Freq: Two times a day (BID) | ORAL | Status: DC

## 2011-11-14 NOTE — Assessment & Plan Note (Signed)
Stable He wishes to avoid PPM  Will reassess heart rate in sinus

## 2011-11-14 NOTE — Progress Notes (Signed)
PCP:  Elie Confer, MD, MD Primary Cardiologist:  Dr Donnie Aho  The patient presents today for routine electrophysiology followup.  Since last being seen in our clinic, the patient reports doing well.  He is tolerating xarelto without problems.  He has fatigue with afib.  Today, he denies symptoms of palpitations, chest pain, shortness of breath, orthopnea, PND, lower extremity edema, dizziness, presyncope, syncope, or neurologic sequela.  The patient feels that he is tolerating medications without difficulties and is otherwise without complaint today.   Past Medical History  Diagnosis Date  . Syncope 2012    negative workup  . Lumbar disc disease   . Atrial flutter 3 /13  . Atrial fibrillation   . DJD (degenerative joint disease)    Past Surgical History  Procedure Date  . Laparoscopic cholecystectomy   . Lumbar laminectomy   . Knee surgery   . Appendectomy   . Back surgery     Current Outpatient Prescriptions  Medication Sig Dispense Refill  . diltiazem (CARDIZEM CD) 240 MG 24 hr capsule Take 360 mg by mouth daily.      . Rivaroxaban (XARELTO) 20 MG TABS Take 20 mg by mouth daily.  30 tablet  11  . TAZTIA XT 360 MG 24 hr capsule         No Known Allergies  History   Social History  . Marital Status: Married    Spouse Name: N/A    Number of Children: N/A  . Years of Education: N/A   Occupational History  . Not on file.   Social History Main Topics  . Smoking status: Never Smoker   . Smokeless tobacco: Never Used  . Alcohol Use: Yes     couple of glasses of wine per week  . Drug Use: No  . Sexually Active: Not on file   Other Topics Concern  . 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     Family History  Problem Relation Age of Onset  . Coronary artery disease Father 41    h/o heavy ETOH    Physical Exam: Filed Vitals:   11/14/11 0838  BP: 130/78  Pulse: 70  Resp: 18  Height: 6'  1" (1.854 m)  Weight: 244 lb 1.9 oz (110.732 kg)    GEN- The patient is well appearing, alert and oriented x 3 today.   Head- normocephalic, atraumatic Eyes-  Sclera clear, conjunctiva pink Ears- hearing intact Oropharynx- clear Neck- supple, no JVP Lymph- no cervical lymphadenopathy Lungs- Clear to ausculation bilaterally, normal work of breathing Heart- Regular rate and rhythm, no murmurs, rubs or gallops, PMI not laterally displaced GI- soft, NT, ND, + BS Extremities- no clubbing, cyanosis, or edema MS- no significant deformity or atrophy Skin- no rash or lesion Psych- euthymic mood, full affect Neuro- strength and sensation are intact  ekg today reveals afib, V rate 86, incomplete RBBB  Assessment and Plan:

## 2011-11-14 NOTE — Assessment & Plan Note (Signed)
Therapeutic on xarelto Check BMET and CBC today Add flecainide 100mg  BID Return for EKG in 1 week.  IF he remains in afib, he will need to be scheduled for cardioversion at that time.

## 2011-11-14 NOTE — Patient Instructions (Addendum)
Your physician recommends that you schedule a follow-up appointment in: 1 week for EKG  If in sinus rhythm arrange follow up for Dr Johney Frame in 6 weeks if still out of rhythm arrange Cardioversion   Your physician has recommended you make the following change in your medication:  1) Start Flecainide 100mg  twice daily  Your physician recommends that you return for lab work today

## 2011-11-14 NOTE — Assessment & Plan Note (Signed)
Continue cardizem  

## 2011-11-22 ENCOUNTER — Other Ambulatory Visit: Payer: Self-pay

## 2011-11-22 ENCOUNTER — Ambulatory Visit (INDEPENDENT_AMBULATORY_CARE_PROVIDER_SITE_OTHER): Payer: Medicare Other

## 2011-11-22 VITALS — BP 110/74 | HR 61 | Ht 73.0 in | Wt 241.6 lb

## 2011-11-22 DIAGNOSIS — I4891 Unspecified atrial fibrillation: Secondary | ICD-10-CM

## 2011-11-22 NOTE — Progress Notes (Signed)
Patient came by office for EKG.EKG was done, revealed AFib.Dr.Allred's nurse will schedule patient to have outpatient cardioversion.Patient was told Dr Jenel Lucks nurse will be calling him back with cardioversion date and time.

## 2011-11-25 ENCOUNTER — Other Ambulatory Visit: Payer: Self-pay | Admitting: Cardiology

## 2011-11-25 DIAGNOSIS — K219 Gastro-esophageal reflux disease without esophagitis: Secondary | ICD-10-CM | POA: Diagnosis not present

## 2011-11-25 DIAGNOSIS — E669 Obesity, unspecified: Secondary | ICD-10-CM | POA: Diagnosis not present

## 2011-11-25 DIAGNOSIS — I4892 Unspecified atrial flutter: Secondary | ICD-10-CM | POA: Diagnosis not present

## 2011-11-25 NOTE — H&P (Signed)
Wyatt Duran, Grier L  Date of visit:  11/25/2011 DOB:  04/18/1946    Age:  66 yrs. Medical record number:  [id number]     Account number:  [id number] Primary Care Provider: WESTERMANN, CAROLA ____________________________ CURRENT DIAGNOSES  1. Arrhythmia-Atrial Flutter  2. GERD  3. Obesity(BMI30-40)  4. History of syncope ____________________________ ALLERGIES  NKDA ____________________________ MEDICATIONS  1. Xarelto 20 mg tablet, 1 p.o. daily  2. flecainide 100 mg tablet, 1 p.o. daily  3. diltiazem HCl 240 mg capsule,ext release degradable, 1 p.o. daily ____________________________ HISTORY OF PRESENT ILLNESS  Patient seen for evaluation prior to cardioversion. The patient has a history of what sounds like vasovagal syncope around one year ago. While he was out of town and was evaluated. No cause was found for this. He was found in March to be in rapid atrial flutter which resolved with diltiazem but was back in atrial flutter when he was seen back in the office. He was placed on an event monitor and was noted to have both rapid atrial fibrillation and flutter as well as occasional pauses of up to 4 seconds and occurred. He was seen by Dr. Allred who started him on flecainide 100 mg twice daily but he failed to convert to sinus rhythm. He has had significant fatigue while he has been out of rhythm and Dr. Allred placed him on Xarelto 20 mg on April 28. He has been out of anticoagulated and is brought in at this time for elective cardioversion to see if he will maintain sinus rhythm. The patient would like to avoid a pacemaker placement and Dr. Allred's feeling was that perhaps if he was able to maintain sinus rhythm then he could feel better and avoid a pacemaker down the road. Alternatively if he failed to maintain sinus rhythm ablation or Tikosyn could be tried.  ____________________________ PAST HISTORY  Past Medical Illnesses:  GERD, BPH;  Cardiovascular Illnesses:  atrial fibrillation, atrial  flutter;  Surgical Procedures:  back surgery, knee surgery x 4, cholecystectomy (lap), appendectomy;  Cardiology Procedures-Invasive:  no history of prior cardiac procedures;  Cardiology Procedures-Noninvasive:  stress echocardiogram March 2012, echocardiogram March 2013, event monitor March 2013;  LVEF of 60% documented via echocardiogram on 09/22/2011  CHADS Score:  0 ____________________________ CARDIO-PULMONARY TEST DATES EKG Date:  09/27/2011;  Holter/Event Monitor Date: 09/28/2011;  Echocardiography Date: 09/22/2011;  Chest Xray Date: 09/21/2011;   ____________________________ FAMILY HISTORY Father - age 75,  history of CAD and died of alcohol abuse; Mother - age 83,  deceased and pacemaker; Brother 1 -  alive and well;  ____________________________ SOCIAL HISTORY Alcohol Use:  occasionally;  Smoking:  never smoked;  Diet:  regular diet;  Lifestyle:  married;  Exercise:  walking greater than 90 minutes 5 days per week;  Occupation:  engineer and retired;  Residence:  lives with wife;   ____________________________ REVIEW OF SYSTEMS General:  malaise and fatigue  Integumentary:  no rashes or new skin lesions.  Eyes:  wears eye glasses/contact lenses  Ears, Nose, Throat, Mouth:  denies any hearing loss, epistaxis, hoarseness or difficulty speaking.  Respiratory:  denies dyspnea, cough, wheezing or hemoptysis.  Cardiovascular:  please review HPI  Abdominal:  denies dyspepsia, GI bleeding, constipation, or diarrhea  Genitourinary-Male:  mild erectile dysfunction, denies urgency, frequency, nocturia Musculoskeletal:  mild arthritis  Neurological:  denies headaches, stroke, or TIA ____________________________ PHYSICAL EXAMINATION VITAL SIGNS  Blood Pressure:  100/70 Sitting, Left arm, regular cuff   Pulse:  76/min. Weight:  240.00 lbs.   Height:  73"BMI: 31  Constitutional:  pleasant white male in no acute distress, mildly obese Skin:  warm and dry to touch, no apparent skin lesions, or masses  noted. Head:  normocephalic, normal hair pattern, no masses or tenderness Eyes:  EOMS Intact, PERRLA, C and S clear, Funduscopic exam not done. ENT:  ears, nose and throat reveal no gross abnormalities.  Dentition good. Neck:  supple, without massess. No JVD, thyromegaly or carotid bruits. Carotid upstroke normal. Chest:  normal symmetry, clear to auscultation and percussion. Cardiac:  irregular rhythm, normal S1 and S2, no S3 or S4, no murmurs,clicks, or rub heard Abdomen:  abdomen soft,non-tender, no masses, no hepatospenomegaly, or aneurysm noted Peripheral Pulses:  the femoral,dorsalis pedis, and posterior tibial pulses are full and equal bilaterally with no bruits auscultated. Extremities & Back:  no deformities, clubbing, cyanosis, erythema or edema observed. Normal muscle strength and tone. Neurological:  no gross motor or sensory deficits noted, affect appropriate, oriented x3. ____________________________ MOST RECENT LIPID PANEL 10/04/10  CHOL TOTL 143 mg/dl, LDL 92 NM, HDL 43 mg/dl and TRIGLYCER 63 mg/dl ____________________________ IMPRESSIONS/PLAN  1. Persistent atrial flutter with tachycardia-bradycardia syndrome 2. History of vasovagal syncope 3. History of reflux  Recommendations:  Plan to proceed with cardioversion on Wednesday, May 29 at 9 AM. Cardioversion procedure was discussed with the patient and his wife fully including risks and they're agreeable to proceed. He has been anticoagulated with Xarelto for one month. ____________________________ TODAYS ORDERS  1. Comprehensive Metabolic Panel: Today  2. Complete Blood Count: Today  3. Electrical Cardioversion: 1 week                       ____________________________ Cardiology Physician:  W. Spencer Shernita Rabinovich, Jr. MD [facility]    

## 2011-11-29 ENCOUNTER — Encounter (HOSPITAL_COMMUNITY): Payer: Self-pay | Admitting: Pharmacy Technician

## 2011-11-29 DIAGNOSIS — I4892 Unspecified atrial flutter: Secondary | ICD-10-CM | POA: Diagnosis not present

## 2011-11-29 MED ORDER — SODIUM CHLORIDE 0.9 % IV SOLN
INTRAVENOUS | Status: DC
  Administered 2011-11-30: 1000 mL via INTRAVENOUS

## 2011-11-30 ENCOUNTER — Encounter (HOSPITAL_COMMUNITY)
Admission: RE | Disposition: A | Discharge status: Home / Self Care | Payer: Self-pay | Source: Ambulatory Visit | Attending: Cardiology

## 2011-11-30 ENCOUNTER — Ambulatory Visit (HOSPITAL_COMMUNITY): Payer: Medicare Other | Admitting: Certified Registered"

## 2011-11-30 ENCOUNTER — Encounter (HOSPITAL_COMMUNITY): Payer: Self-pay | Admitting: Certified Registered"

## 2011-11-30 ENCOUNTER — Ambulatory Visit (HOSPITAL_COMMUNITY)
Admission: RE | Admit: 2011-11-30 | Discharge: 2011-11-30 | Disposition: A | Discharge status: Home / Self Care | Payer: Medicare Other | Source: Ambulatory Visit | Attending: Cardiology | Admitting: Cardiology

## 2011-11-30 DIAGNOSIS — I4892 Unspecified atrial flutter: Secondary | ICD-10-CM | POA: Diagnosis not present

## 2011-11-30 DIAGNOSIS — E669 Obesity, unspecified: Secondary | ICD-10-CM | POA: Diagnosis not present

## 2011-11-30 DIAGNOSIS — K219 Gastro-esophageal reflux disease without esophagitis: Secondary | ICD-10-CM | POA: Insufficient documentation

## 2011-11-30 HISTORY — PX: CARDIOVERSION: SHX1299

## 2011-11-30 SURGERY — CARDIOVERSION
Anesthesia: General | Wound class: Clean

## 2011-11-30 MED ORDER — PROPOFOL 10 MG/ML IV BOLUS
INTRAVENOUS | Status: DC | PRN
  Administered 2011-11-30 (×2): 100 mg via INTRAVENOUS

## 2011-11-30 MED ORDER — HYDROCORTISONE 1 % EX CREA: 1.0000 "application " | TOPICAL_CREAM | Freq: Three times a day (TID) | CUTANEOUS | Status: DC | PRN

## 2011-11-30 MED ORDER — SODIUM CHLORIDE 0.9 % IJ SOLN: 3.0000 mL | INTRAMUSCULAR | Status: DC | PRN

## 2011-11-30 MED ORDER — SODIUM CHLORIDE 0.9 % IV SOLN: 250.0000 mL | INTRAVENOUS | Status: DC

## 2011-11-30 MED ORDER — HYDROCORTISONE 1 % EX CREA
TOPICAL_CREAM | CUTANEOUS | Status: AC
  Filled 2011-11-30: qty 28

## 2011-11-30 MED ORDER — SODIUM CHLORIDE 0.9 % IJ SOLN: 3.0000 mL | Freq: Two times a day (BID) | INTRAMUSCULAR | Status: DC

## 2011-11-30 NOTE — H&P (View-Only) (Signed)
Wyatt Duran  Date of visit:  11/25/2011 DOB:  May 16, 1946    Age:  66 yrs. Medical record number:  [id number]     Account number:  [id number] Primary Care Provider: Carolin Coy ____________________________ CURRENT DIAGNOSES  1. Arrhythmia-Atrial Flutter  2. GERD  3. Obesity(BMI30-40)  4. History of syncope ____________________________ ALLERGIES  NKDA ____________________________ MEDICATIONS  1. Xarelto 20 mg tablet, 1 p.o. daily  2. flecainide 100 mg tablet, 1 p.o. daily  3. diltiazem HCl 240 mg capsule,ext release degradable, 1 p.o. daily ____________________________ HISTORY OF PRESENT ILLNESS  Patient seen for evaluation prior to cardioversion. The patient has a history of what sounds like vasovagal syncope around one year ago. While he was out of town and was evaluated. No cause was found for this. He was found in March to be in rapid atrial flutter which resolved with diltiazem but was back in atrial flutter when he was seen back in the office. He was placed on an event monitor and was noted to have both rapid atrial fibrillation and flutter as well as occasional pauses of up to 4 seconds and occurred. He was seen by Dr. Johney Frame who started him on flecainide 100 mg twice daily but he failed to convert to sinus rhythm. He has had significant fatigue while he has been out of rhythm and Dr. Johney Frame placed him on Xarelto 20 mg on April 28. He has been out of anticoagulated and is brought in at this time for elective cardioversion to see if he will maintain sinus rhythm. The patient would like to avoid a pacemaker placement and Dr. Jenel Lucks feeling was that perhaps if he was able to maintain sinus rhythm then he could feel better and avoid a pacemaker down the road. Alternatively if he failed to maintain sinus rhythm ablation or Tikosyn could be tried.  ____________________________ PAST HISTORY  Past Medical Illnesses:  GERD, BPH;  Cardiovascular Illnesses:  atrial fibrillation, atrial  flutter;  Surgical Procedures:  back surgery, knee surgery x 4, cholecystectomy (lap), appendectomy;  Cardiology Procedures-Invasive:  no history of prior cardiac procedures;  Cardiology Procedures-Noninvasive:  stress echocardiogram March 2012, echocardiogram March 2013, event monitor March 2013;  LVEF of 60% documented via echocardiogram on 09/22/2011  CHADS Score:  0 ____________________________ CARDIO-PULMONARY TEST DATES EKG Date:  09/27/2011;  Holter/Event Monitor Date: 09/28/2011;  Echocardiography Date: 09/22/2011;  Chest Xray Date: 09/21/2011;   ____________________________ FAMILY HISTORY Father - age 65,  history of CAD and died of alcohol abuse; Mother - age 73,  deceased and pacemaker; Brother 1 -  alive and well;  ____________________________ SOCIAL HISTORY Alcohol Use:  occasionally;  Smoking:  never smoked;  Diet:  regular diet;  Lifestyle:  married;  Exercise:  walking greater than 90 minutes 5 days per week;  Occupation:  Art gallery manager and retired;  Residence:  lives with wife;   ____________________________ REVIEW OF SYSTEMS General:  malaise and fatigue  Integumentary:  no rashes or new skin lesions.  Eyes:  wears eye glasses/contact lenses  Ears, Nose, Throat, Mouth:  denies any hearing loss, epistaxis, hoarseness or difficulty speaking.  Respiratory:  denies dyspnea, cough, wheezing or hemoptysis.  Cardiovascular:  please review HPI  Abdominal:  denies dyspepsia, GI bleeding, constipation, or diarrhea  Genitourinary-Male:  mild erectile dysfunction, denies urgency, frequency, nocturia Musculoskeletal:  mild arthritis  Neurological:  denies headaches, stroke, or TIA ____________________________ PHYSICAL EXAMINATION VITAL SIGNS  Blood Pressure:  100/70 Sitting, Left arm, regular cuff   Pulse:  76/min. Weight:  240.00 lbs.  Height:  73"BMI: 31  Constitutional:  pleasant white male in no acute distress, mildly obese Skin:  warm and dry to touch, no apparent skin lesions, or masses  noted. Head:  normocephalic, normal hair pattern, no masses or tenderness Eyes:  EOMS Intact, PERRLA, C and S clear, Funduscopic exam not done. ENT:  ears, nose and throat reveal no gross abnormalities.  Dentition good. Neck:  supple, without massess. No JVD, thyromegaly or carotid bruits. Carotid upstroke normal. Chest:  normal symmetry, clear to auscultation and percussion. Cardiac:  irregular rhythm, normal S1 and S2, no S3 or S4, no murmurs,clicks, or rub heard Abdomen:  abdomen soft,non-tender, no masses, no hepatospenomegaly, or aneurysm noted Peripheral Pulses:  the femoral,dorsalis pedis, and posterior tibial pulses are full and equal bilaterally with no bruits auscultated. Extremities & Back:  no deformities, clubbing, cyanosis, erythema or edema observed. Normal muscle strength and tone. Neurological:  no gross motor or sensory deficits noted, affect appropriate, oriented x3. ____________________________ MOST RECENT LIPID PANEL 10/04/10  CHOL TOTL 143 mg/dl, LDL 92 NM, HDL 43 mg/dl and TRIGLYCER 63 mg/dl ____________________________ IMPRESSIONS/PLAN  1. Persistent atrial flutter with tachycardia-bradycardia syndrome 2. History of vasovagal syncope 3. History of reflux  Recommendations:  Plan to proceed with cardioversion on Wednesday, May 29 at 9 AM. Cardioversion procedure was discussed with the patient and his wife fully including risks and they're agreeable to proceed. He has been anticoagulated with Xarelto for one month. ____________________________ TODAYS ORDERS  1. Comprehensive Metabolic Panel: Today  2. Complete Blood Count: Today  3. Electrical Cardioversion: 1 week                       ____________________________ Cardiology Physician:  Darden Palmer MD [facility]

## 2011-11-30 NOTE — Preoperative (Signed)
Beta Blockers   Reason not to administer Beta Blockers:Not Applicable 

## 2011-11-30 NOTE — Transfer of Care (Signed)
Immediate Anesthesia Transfer of Care Note  Patient: Wyatt Duran  Procedure(s) Performed: Procedure(s) (LRB): CARDIOVERSION (N/A)  Patient Location: Short Stay  Anesthesia Type: General  Level of Consciousness: awake, alert , oriented and patient cooperative  Airway & Oxygen Therapy: Patient Spontanous Breathing and Patient connected to nasal cannula oxygen  Post-op Assessment: Post -op Vital signs reviewed and stable  Post vital signs: Reviewed and stable  Complications: No apparent anesthesia complications

## 2011-11-30 NOTE — Anesthesia Postprocedure Evaluation (Signed)
  Anesthesia Post-op Note  Patient: Wyatt Duran  Procedure(s) Performed: Procedure(s) (LRB): CARDIOVERSION (N/A)  Patient Location: Short Stay  Anesthesia Type: General  Level of Consciousness: awake, alert , oriented and patient cooperative  Airway and Oxygen Therapy: Patient Spontanous Breathing and Patient connected to nasal cannula oxygen  Post-op Pain: none  Post-op Assessment: Post-op Vital signs reviewed, Patient's Cardiovascular Status Stable, Respiratory Function Stable, Patent Airway and No signs of Nausea or vomiting  Post-op Vital Signs: Reviewed and stable  Complications: No apparent anesthesia complications

## 2011-11-30 NOTE — Anesthesia Preprocedure Evaluation (Addendum)
Anesthesia Evaluation  Patient identified by MRN, date of birth, ID band Patient awake    Reviewed: Allergy & Precautions, H&P , NPO status , Patient's Chart, lab work & pertinent test results  Airway Mallampati: I TM Distance: >3 FB Neck ROM: Full    Dental   Pulmonary          Cardiovascular + dysrhythmias Atrial Fibrillation     Neuro/Psych    GI/Hepatic   Endo/Other    Renal/GU      Musculoskeletal   Abdominal   Peds  Hematology   Anesthesia Other Findings   Reproductive/Obstetrics                          Anesthesia Physical Anesthesia Plan  ASA: III  Anesthesia Plan: General   Post-op Pain Management:    Induction: Intravenous  Airway Management Planned: Mask  Additional Equipment:   Intra-op Plan:   Post-operative Plan:   Informed Consent:   Dental advisory given  Plan Discussed with: CRNA, Anesthesiologist and Surgeon  Anesthesia Plan Comments:         Anesthesia Quick Evaluation

## 2011-11-30 NOTE — CV Procedure (Signed)
Electrical Cardioversion Procedure Note  Wyatt Duran   66 y.o. male MRN: [id number] DOB: Nov 12, 1945  Today's date: 11/30/2011  Procedure: Electrical Cardioversion  Indications:  Atrial Flutter  Time Out: Verified patient identification, verified procedure,medications/allergies/relevent history reviewed, required imaging and test results available.    Procedure Details  The patient was NPO after midnight. Anesthesia was administered at the beside  by Dr.Singer with 100 mg of propofol.  Cardioversion was done with synchronized biphasic defibrillation with AP pads with 100 watts.  The patient converted to normal sinus rhythm. But then reverted to atrial flutter after 5 minutes. He had regained consciousness by that time.  Repeat anesthesia was administered at the beside  by Dr.Singer with 100 mg of propofol.  Cardioversion was done with synchronized biphasic defibrillation with AP pads with 100 watts.  The patient converted to normal sinus rhythm but then reverted to atrial flutter after 1 minute. The procedure was then terminated. The patient tolerated the procedure well   IMPRESSION:  Unsuccessful cardioversion of atrial flutter.  Rec:  Discontinue flecainide.  Follow up with Dr. Johney Frame to discuss possible ablation versus different antiarrhythmic.   Darden Palmer. MD [facility]   11/30/2011, 9:26 AM

## 2011-11-30 NOTE — Discharge Instructions (Signed)
General Anesthetic, Adult A doctor specialized in giving anesthesia (anesthesiologist) or a nurse specialized in giving anesthesia (nurse anesthetist) gives medicine that makes you sleep while a procedure is performed (general anesthetic). Once the general anesthetic has been administered, you will be in a sleeplike state in which you feel no pain. After having a general anestheticyou may feel:   Dizzy.   Weak.   Drowsy.   Confused.  These feelings are normal and can be expected to last for up to 24 hours after the procedure is completed.  LET YOUR CAREGIVER KNOW ABOUT:  Allergies you have.   Medications you are taking, including herbs, eye drops, over the counter medications, dietary supplements, and creams.   Previous problems you have had with anesthetics or numbing medicines.   Use of cigarettes, alcohol, or illicit drugs.   Possibility of pregnancy, if this applies.   History of bleeding or blood disorders, including blood clots and clotting disorders.   Previous surgeries you have had and types of anesthetics you have received.   Family medical history, especially anesthetic problems.   Other health problems.  BEFORE THE PROCEDURE  You may brush your teeth on the morning of surgery but you should have no solid food or non-clear liquids for a minimum of 8 hours prior to your procedure. Clear liquids (water, black coffee, and tea) are acceptable in small amounts until 2 hours prior to your procedure.   You may take your regular medications the morning of your procedure unless your caregiver indicates otherwise.  AFTER THE PROCEDURE  After surgery, you will be taken to the recovery area where a nurse will monitor your progress. You will be allowed to go home when you are awake, stable, taking fluids well, and without serious pain or complications.   For the first 24 hours following an anesthetic:   Have a responsible person with you.   Do not drive a car. If you are  alone, do not take public transportation.   Do not engage in strenuous activity. You may usually resume normal activities the next day, or as advised by your caregiver.   Do not drink alcohol.   Do not take medicine that has not been prescribed by your caregiver.   Do not sign important papers or make important decisions as your judgement may be impaired.   You may resume a normal diet as directed.   Change bandages (dressings) as directed.   Only take over-the-counter or prescription medicines for pain, discomfort, or fever as directed by your caregiver.  If you have questions or problems that seem related to the anesthetic, call the hospital and ask for the anesthetist, anesthesiologist, or anesthesia department. SEEK IMMEDIATE MEDICAL CARE IF:   You develop a rash.   You have difficulty breathing.   You have chest pain.   You have allergic problems.   You have uncontrolled nausea.   You have uncontrolled vomiting.   You develop any serious bleeding, especially from the incision site.  Document Released: 09/27/2007 Document Revised: 06/09/2011 Document Reviewed: 10/21/2010 Exit[employer] Patient Information 2012 [employer]. 

## 2011-11-30 NOTE — Interval H&P Note (Signed)
History and Physical Interval Note:  11/30/2011 9:01 AM  Wyatt Duran  has presented today for cardioversion with the diagnosis of AFLUTTER  The various methods of treatment have been discussed with the patient and family. After consideration of risks, benefits and other options for treatment, the patient has consented to CARDIOVERSION  .  The patients' history has been reviewed, patient examined, no change in status, stable for surgery.  I have reviewed the patients' chart and labs.  Questions were answered to the patient's satisfaction.    Darden Palmer MD [facility]

## 2011-12-01 ENCOUNTER — Encounter (HOSPITAL_COMMUNITY): Payer: Self-pay | Admitting: Cardiology

## 2011-12-05 ENCOUNTER — Encounter: Payer: Self-pay | Admitting: Internal Medicine

## 2011-12-05 ENCOUNTER — Encounter: Payer: Self-pay | Admitting: *Deleted

## 2011-12-05 ENCOUNTER — Other Ambulatory Visit: Payer: Self-pay | Admitting: *Deleted

## 2011-12-05 ENCOUNTER — Ambulatory Visit (INDEPENDENT_AMBULATORY_CARE_PROVIDER_SITE_OTHER): Payer: Medicare Other | Admitting: Internal Medicine

## 2011-12-05 VITALS — BP 102/88 | HR 148 | Ht 73.0 in | Wt 240.0 lb

## 2011-12-05 DIAGNOSIS — I4891 Unspecified atrial fibrillation: Secondary | ICD-10-CM

## 2011-12-05 DIAGNOSIS — I495 Sick sinus syndrome: Secondary | ICD-10-CM

## 2011-12-05 DIAGNOSIS — I4892 Unspecified atrial flutter: Secondary | ICD-10-CM | POA: Diagnosis not present

## 2011-12-05 DIAGNOSIS — I482 Chronic atrial fibrillation, unspecified: Secondary | ICD-10-CM

## 2011-12-05 LAB — BASIC METABOLIC PANEL
CO2: 26 mEq/L (ref 19–32)
Calcium: 9.5 mg/dL (ref 8.4–10.5)
Chloride: 108 mEq/L (ref 96–112)
Glucose, Bld: 90 mg/dL (ref 70–99)
Potassium: 4 mEq/L (ref 3.5–5.1)
Sodium: 142 mEq/L (ref 135–145)

## 2011-12-05 LAB — CBC WITH DIFFERENTIAL/PLATELET
Basophils Absolute: 0 10*3/uL (ref 0.0–0.1)
Basophils Relative: 0.6 % (ref 0.0–3.0)
Eosinophils Absolute: 0.1 10*3/uL (ref 0.0–0.7)
HCT: 45.7 % (ref 39.0–52.0)
Hemoglobin: 15.1 g/dL (ref 13.0–17.0)
Lymphocytes Relative: 26.8 % (ref 12.0–46.0)
Lymphs Abs: 1.7 10*3/uL (ref 0.7–4.0)
MCHC: 33.2 g/dL (ref 30.0–36.0)
MCV: 90.9 fl (ref 78.0–100.0)
Monocytes Absolute: 0.5 10*3/uL (ref 0.1–1.0)
Neutro Abs: 4 10*3/uL (ref 1.4–7.7)
RBC: 5.02 Mil/uL (ref 4.22–5.81)
RDW: 14.7 % — ABNORMAL HIGH (ref 11.5–14.6)

## 2011-12-05 NOTE — Progress Notes (Signed)
PCP:  Elie Confer, MD, MD Primary Cardiologist:  Dr Donnie Aho  The patient presents today for routine electrophysiology followup.  He is tolerating xarelto without problems.  He has fatigue and recently progressive SOB with afib. He underwent DCC on flecainide last week by Dr Donnie Aho.  Though he initially converted to sinus, he returned to afib within minutes.  He is in atrial flutter today. Today, he denies symptoms of palpitations, chest pain, shortness of breath, orthopnea, PND, lower extremity edema, dizziness, presyncope, syncope, or neurologic sequela.  The patient feels that he is tolerating medications without difficulties and is otherwise without complaint today.   Past Medical History  Diagnosis Date  . Syncope 2012    negative workup  . Lumbar disc disease   . Atrial flutter 3 /13  . Atrial fibrillation   . DJD (degenerative joint disease)    Past Surgical History  Procedure Date  . Laparoscopic cholecystectomy   . Lumbar laminectomy   . Knee surgery   . Appendectomy   . Back surgery   . Cardioversion 11/30/2011    Procedure: CARDIOVERSION;  Surgeon: Othella Boyer, MD;  Location: [facility];  Service: Cardiovascular;  Laterality: N/A;    Current Outpatient Prescriptions  Medication Sig Dispense Refill  . Rivaroxaban 20 MG TABS Take 20 mg by mouth daily.      Marland Kitchen TAZTIA XT 360 MG 24 hr capsule Take 360 mg by mouth daily.         No Known Allergies  History   Social History  . Marital Status: Married    Spouse Name: N/A    Number of Children: N/A  . Years of Education: N/A   Occupational History  . Not on file.   Social History Main Topics  . Smoking status: Never Smoker   . Smokeless tobacco: Never Used  . Alcohol Use: Yes     couple of glasses of wine per week  . Drug Use: No  . Sexually Active: Not on file   Other Topics Concern  . Not on file   Social History Narrative   Pt lives in [place].  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     Family  History  Problem Relation Age of Onset  . Coronary artery disease Father 56    h/o heavy ETOH   ROS- all systems reviewed and negative except as per HPI  Physical Exam: Filed Vitals:   12/05/11 1130  BP: 102/88  Pulse: 148  Height: 6\' 1"  (1.854 m)  Weight: 240 lb (108.863 kg)    GEN- The patient is well appearing, alert and oriented x 3 today.   Head- normocephalic, atraumatic Eyes-  Sclera clear, conjunctiva pink Ears- hearing intact Oropharynx- clear Neck- supple, no JVP Lymph- no cervical lymphadenopathy Lungs- Clear to ausculation bilaterally, normal work of breathing Heart- irregular rate and rhythm, no murmurs, rubs or gallops, PMI not laterally displaced GI- soft, NT, ND, + BS Extremities- no clubbing, cyanosis, or edema MS- no significant deformity or atrophy Skin- no rash or lesion Psych- euthymic mood, full affect Neuro- strength and sensation are intact  ekg today reveals typical appearing atrial flutter with incomplete RBBB 2:1 AV conduction at 150 bpm  Assessment and Plan:

## 2011-12-05 NOTE — Patient Instructions (Signed)

## 2011-12-05 NOTE — Assessment & Plan Note (Signed)
As above Proceed with ablation, likely tomorrow.

## 2011-12-05 NOTE — Assessment & Plan Note (Signed)
The patient has persistent atrial fibrillation with symptoms of fatigue and SOB.  He has atrial flutter (typical) also.  He has failed medical therapy with flecainide and diltiazem.  Therapeutic strategies for afib and atrial flutter including medicine and ablation were discussed in detail with the patient today. Risk, benefits, and alternatives to EP study and radiofrequency ablation were also discussed in detail today. These risks include but are not limited to stroke, bleeding, vascular damage, tamponade, perforation, damage to the esophagus, lungs, and other structures, pulmonary vein stenosis, worsening renal function, and death. The patient understands these risk and wishes to proceed.  We will therefore proceed with catheter ablation at the next available time.

## 2011-12-05 NOTE — Assessment & Plan Note (Signed)
Stable without symptoms of bradycardia He would like to avoid PPM if possible.

## 2011-12-06 ENCOUNTER — Encounter (HOSPITAL_COMMUNITY): Payer: Self-pay | Admitting: Anesthesiology

## 2011-12-06 ENCOUNTER — Ambulatory Visit (HOSPITAL_COMMUNITY)
Admission: RE | Admit: 2011-12-06 | Discharge: 2011-12-07 | Disposition: A | Discharge status: Home / Self Care | Payer: Medicare Other | Source: Ambulatory Visit | Attending: Internal Medicine | Admitting: Internal Medicine

## 2011-12-06 ENCOUNTER — Encounter (HOSPITAL_COMMUNITY)
Admission: RE | Disposition: A | Discharge status: Home / Self Care | Payer: Self-pay | Source: Ambulatory Visit | Attending: Internal Medicine

## 2011-12-06 ENCOUNTER — Ambulatory Visit (HOSPITAL_COMMUNITY): Admit: 2011-12-06 | Payer: Self-pay | Admitting: Internal Medicine

## 2011-12-06 ENCOUNTER — Ambulatory Visit (HOSPITAL_COMMUNITY): Admission: RE | Admit: 2011-12-06 | Payer: Medicare Other | Source: Ambulatory Visit | Admitting: Internal Medicine

## 2011-12-06 ENCOUNTER — Ambulatory Visit (HOSPITAL_COMMUNITY): Payer: Medicare Other | Admitting: Anesthesiology

## 2011-12-06 ENCOUNTER — Encounter (HOSPITAL_COMMUNITY): Payer: Self-pay | Admitting: *Deleted

## 2011-12-06 DIAGNOSIS — I4891 Unspecified atrial fibrillation: Secondary | ICD-10-CM | POA: Insufficient documentation

## 2011-12-06 DIAGNOSIS — Q211 Atrial septal defect: Secondary | ICD-10-CM | POA: Insufficient documentation

## 2011-12-06 DIAGNOSIS — I079 Rheumatic tricuspid valve disease, unspecified: Secondary | ICD-10-CM | POA: Insufficient documentation

## 2011-12-06 DIAGNOSIS — Q2111 Secundum atrial septal defect: Secondary | ICD-10-CM | POA: Diagnosis not present

## 2011-12-06 DIAGNOSIS — I4892 Unspecified atrial flutter: Secondary | ICD-10-CM | POA: Insufficient documentation

## 2011-12-06 DIAGNOSIS — M1990 Unspecified osteoarthritis, unspecified site: Secondary | ICD-10-CM | POA: Diagnosis not present

## 2011-12-06 DIAGNOSIS — I059 Rheumatic mitral valve disease, unspecified: Secondary | ICD-10-CM | POA: Insufficient documentation

## 2011-12-06 DIAGNOSIS — I4819 Other persistent atrial fibrillation: Secondary | ICD-10-CM | POA: Insufficient documentation

## 2011-12-06 DIAGNOSIS — I495 Sick sinus syndrome: Secondary | ICD-10-CM | POA: Insufficient documentation

## 2011-12-06 HISTORY — PX: OTHER SURGICAL HISTORY: SHX169

## 2011-12-06 HISTORY — PX: TEE WITHOUT CARDIOVERSION: SHX5443

## 2011-12-06 HISTORY — PX: ATRIAL FIBRILLATION ABLATION: SHX5456

## 2011-12-06 LAB — POCT ACTIVATED CLOTTING TIME
Activated Clotting Time: 155 seconds
Activated Clotting Time: 248 seconds

## 2011-12-06 LAB — MRSA PCR SCREENING: MRSA by PCR: NEGATIVE

## 2011-12-06 SURGERY — ECHOCARDIOGRAM, TRANSESOPHAGEAL
Anesthesia: Moderate Sedation

## 2011-12-06 SURGERY — ATRIAL FIBRILLATION ABLATION
Anesthesia: General

## 2011-12-06 SURGERY — ATRIAL FIBRILLATION ABLATION
Anesthesia: Monitor Anesthesia Care

## 2011-12-06 MED ORDER — BUTAMBEN-TETRACAINE-BENZOCAINE 2-2-14 % EX AERO
INHALATION_SPRAY | CUTANEOUS | Status: DC | PRN
  Administered 2011-12-06: 2 via TOPICAL

## 2011-12-06 MED ORDER — SODIUM CHLORIDE 0.9 % IJ SOLN: 3.0000 mL | Freq: Two times a day (BID) | INTRAMUSCULAR | Status: DC

## 2011-12-06 MED ORDER — LIDOCAINE HCL (CARDIAC) 20 MG/ML IV SOLN
INTRAVENOUS | Status: DC | PRN
  Administered 2011-12-06: 100 mg via INTRAVENOUS

## 2011-12-06 MED ORDER — SODIUM CHLORIDE 0.9 % IJ SOLN: 3.0000 mL | INTRAMUSCULAR | Status: DC | PRN

## 2011-12-06 MED ORDER — SODIUM CHLORIDE 0.9 % IV SOLN: 250.0000 mL | INTRAVENOUS | Status: DC | PRN

## 2011-12-06 MED ORDER — HYDROCODONE-ACETAMINOPHEN 5-325 MG PO TABS
1.0000 | ORAL_TABLET | ORAL | Status: DC | PRN
  Filled 2011-12-06: qty 2

## 2011-12-06 MED ORDER — BENZOCAINE 20 % MT SOLN: 1.0000 "application " | OROMUCOSAL | Status: DC | PRN

## 2011-12-06 MED ORDER — MIDAZOLAM HCL 10 MG/2ML IJ SOLN
INTRAMUSCULAR | Status: DC | PRN
  Administered 2011-12-06: 4 mg via INTRAVENOUS
  Administered 2011-12-06: 2 mg via INTRAVENOUS

## 2011-12-06 MED ORDER — ONDANSETRON HCL 4 MG/2ML IJ SOLN: 4.0000 mg | Freq: Four times a day (QID) | INTRAMUSCULAR | Status: DC | PRN

## 2011-12-06 MED ORDER — RIVAROXABAN 10 MG PO TABS
20.0000 mg | ORAL_TABLET | Freq: Every day | ORAL | Status: DC
  Administered 2011-12-06: 20 mg via ORAL
  Filled 2011-12-06 (×2): qty 2

## 2011-12-06 MED ORDER — LACTATED RINGERS IV SOLN
INTRAVENOUS | Status: DC | PRN
  Administered 2011-12-06 (×2): via INTRAVENOUS

## 2011-12-06 MED ORDER — PHENYLEPHRINE HCL 10 MG/ML IJ SOLN
INTRAMUSCULAR | Status: DC | PRN
  Administered 2011-12-06: 80 ug via INTRAVENOUS
  Administered 2011-12-06: 120 ug via INTRAVENOUS
  Administered 2011-12-06 (×5): 80 ug via INTRAVENOUS
  Administered 2011-12-06: 120 ug via INTRAVENOUS
  Administered 2011-12-06: 80 ug via INTRAVENOUS

## 2011-12-06 MED ORDER — FENTANYL CITRATE 0.05 MG/ML IJ SOLN
INTRAMUSCULAR | Status: DC | PRN
  Administered 2011-12-06: 50 ug via INTRAVENOUS

## 2011-12-06 MED ORDER — SODIUM CHLORIDE 0.45 % IV SOLN: INTRAVENOUS | Status: DC

## 2011-12-06 MED ORDER — PROPOFOL 10 MG/ML IV BOLUS
INTRAVENOUS | Status: DC | PRN
  Administered 2011-12-06: 160 mg via INTRAVENOUS

## 2011-12-06 MED ORDER — FENTANYL CITRATE 0.05 MG/ML IJ SOLN
INTRAMUSCULAR | Status: AC
  Filled 2011-12-06: qty 2

## 2011-12-06 MED ORDER — ACETAMINOPHEN 325 MG PO TABS: 650.0000 mg | ORAL_TABLET | ORAL | Status: DC | PRN

## 2011-12-06 MED ORDER — LIDOCAINE VISCOUS 2 % MT SOLN
OROMUCOSAL | Status: AC
  Filled 2011-12-06: qty 15

## 2011-12-06 MED ORDER — ONDANSETRON HCL 4 MG/2ML IJ SOLN
INTRAMUSCULAR | Status: DC | PRN
  Administered 2011-12-06: 4 mg via INTRAVENOUS

## 2011-12-06 MED ORDER — FLECAINIDE ACETATE 100 MG PO TABS
100.0000 mg | ORAL_TABLET | Freq: Two times a day (BID) | ORAL | Status: DC
  Administered 2011-12-06: 100 mg via ORAL
  Filled 2011-12-06 (×3): qty 1

## 2011-12-06 MED ORDER — MIDAZOLAM HCL 10 MG/2ML IJ SOLN: 10.0000 mg | Freq: Once | INTRAMUSCULAR | Status: DC

## 2011-12-06 MED ORDER — HYDROXYUREA 500 MG PO CAPS
ORAL_CAPSULE | ORAL | Status: AC
  Filled 2011-12-06: qty 1

## 2011-12-06 MED ORDER — FENTANYL CITRATE 0.05 MG/ML IJ SOLN
INTRAMUSCULAR | Status: DC | PRN
  Administered 2011-12-06: 25 ug via INTRAVENOUS
  Administered 2011-12-06: 150 ug via INTRAVENOUS

## 2011-12-06 MED ORDER — BUPIVACAINE HCL (PF) 0.25 % IJ SOLN
INTRAMUSCULAR | Status: AC
  Filled 2011-12-06: qty 60

## 2011-12-06 MED ORDER — SODIUM CHLORIDE 0.9 % IJ SOLN
3.0000 mL | Freq: Two times a day (BID) | INTRAMUSCULAR | Status: DC
  Administered 2011-12-06: 3 mL via INTRAVENOUS

## 2011-12-06 MED ORDER — MIDAZOLAM HCL 10 MG/2ML IJ SOLN
INTRAMUSCULAR | Status: AC
  Filled 2011-12-06: qty 2

## 2011-12-06 MED ORDER — SODIUM CHLORIDE 0.9 % IV SOLN
1000.0000 ug | INTRAVENOUS | Status: DC | PRN
  Administered 2011-12-06: 20 ug/min via INTRAVENOUS

## 2011-12-06 MED ORDER — RIVAROXABAN 20 MG PO TABS: 20.0000 mg | ORAL_TABLET | Freq: Every day | ORAL | Status: DC

## 2011-12-06 MED ORDER — FENTANYL CITRATE 0.05 MG/ML IJ SOLN: 250.0000 ug | Freq: Once | INTRAMUSCULAR | Status: DC

## 2011-12-06 NOTE — Interval H&P Note (Signed)
History and Physical Interval Note:  12/06/2011 10:37 AM  Wyatt Duran  has presented today for surgery, with the diagnosis of afib  The various methods of treatment have been discussed with the patient and family. After consideration of risks, benefits and other options for treatment, the patient has consented to  Procedure(s) (LRB): TRANSESOPHAGEAL ECHOCARDIOGRAM (TEE) (N/A) as a surgical intervention .  The patients' history has been reviewed, patient examined, no change in status, stable for surgery.  I have reviewed the patients' chart and labs.  Questions were answered to the patient's satisfaction.     Raileigh Sabater

## 2011-12-06 NOTE — Anesthesia Postprocedure Evaluation (Signed)
Anesthesia Post Note  Patient: Wyatt Duran  Procedure(s) Performed: Procedure(s) (LRB): ATRIAL FIBRILLATION ABLATION (N/A)  Anesthesia type: general  Patient location: PACU  Post pain: Pain level controlled  Post assessment: Patient's Cardiovascular Status Stable  Last Vitals:  Filed Vitals:   12/06/11 1150  BP: 105/82  Pulse:   Temp:   Resp: 9    Post vital signs: Reviewed and stable  Level of consciousness: sedated  Complications: No apparent anesthesia complications

## 2011-12-06 NOTE — Anesthesia Preprocedure Evaluation (Addendum)
Anesthesia Evaluation  Patient identified by MRN, date of birth, ID band Patient awake    Reviewed: Allergy & Precautions, H&P , NPO status , Patient's Chart, lab work & pertinent test results  History of Anesthesia Complications Negative for: history of anesthetic complications  Airway Mallampati: II  Neck ROM: Full    Dental  (+) Teeth Intact and Dental Advisory Given   Pulmonary neg pulmonary ROS,    Pulmonary exam normal       Cardiovascular + dysrhythmias Atrial Fibrillation     Neuro/Psych    GI/Hepatic negative GI ROS, Neg liver ROS,   Endo/Other  negative endocrine ROS  Renal/GU negative Renal ROS     Musculoskeletal   Abdominal   Peds  Hematology   Anesthesia Other Findings   Reproductive/Obstetrics                          Anesthesia Physical Anesthesia Plan  ASA: III  Anesthesia Plan: General   Post-op Pain Management:    Induction: Intravenous  Airway Management Planned: LMA  Additional Equipment:   Intra-op Plan:   Post-operative Plan: Extubation in OR  Informed Consent: I have reviewed the patients History and Physical, chart, labs and discussed the procedure including the risks, benefits and alternatives for the proposed anesthesia with the patient or authorized representative who has indicated his/her understanding and acceptance.   Dental advisory given  Plan Discussed with: CRNA, Anesthesiologist and Surgeon  Anesthesia Plan Comments:         Anesthesia Quick Evaluation

## 2011-12-06 NOTE — Interval H&P Note (Signed)
History and Physical Interval Note:  12/06/2011 12:48 PM  Wyatt Duran  has presented today for surgery, with the diagnosis of afib  The various methods of treatment have been discussed with the patient and family. After consideration of risks, benefits and other options for treatment, the patient has consented to  Procedure(s) (LRB): ATRIAL FIBRILLATION ABLATION (N/A) as a surgical intervention .  The patients' history has been reviewed, patient examined, no change in status, stable for surgery.  I have reviewed the patients' chart and labs.  Questions were answered to the patient's satisfaction.     Hillis Range

## 2011-12-06 NOTE — Transfer of Care (Signed)
Immediate Anesthesia Transfer of Care Note  Patient: Wyatt Duran  Procedure(s) Performed: Procedure(s) (LRB): ATRIAL FIBRILLATION ABLATION (N/A)  Patient Location: Cath Lab  Anesthesia Type: General  Level of Consciousness: awake, alert  and oriented  Airway & Oxygen Therapy: Patient Spontanous Breathing and Patient connected to nasal cannula oxygen  Post-op Assessment: Report given to PACU RN, Post -op Vital signs reviewed and stable and Patient moving all extremities X 4  Post vital signs: Reviewed and stable  Complications: No apparent anesthesia complications

## 2011-12-06 NOTE — Progress Notes (Signed)
  Echocardiogram Echocardiogram Transesophageal has been performed.  Wyatt Duran [facility] 12/06/2011, 3:18 PM

## 2011-12-06 NOTE — Op Note (Signed)
SURGEON:  Hillis Range, MD  PREPROCEDURE DIAGNOSES: 1. Paroxysmal atrial fibrillation. 2. Typical appearing atrial flutter  POSTPROCEDURE DIAGNOSES: 1. Paroxysmal  atrial fibrillation. 2. Isthmus dependant right appearing atrial flutter  PROCEDURES: 1. Comprehensive electrophysiologic study. 2. Coronary sinus pacing and recording. 3. Three-dimensional mapping of atrial fibrillation and also atrial flutter 4. Ablation of atrial fibrillation and also atrial flutter 5. Arterial blood pressure monitoring. 6. Intracardiac echocardiography. 7. Transseptal puncture of an intact septum. 8. Rotational Angiography with processing at an independent workstation 9. Arrhythmia induction with pacing with isuprel infusion  INTRODUCTION:  BARAA TUBBS is a 66 y.o. male with a history of paroxysmal atrial fibrillation and typical appearing atrial flutter who now presents for EP study and radiofrequency ablation.  The patient reports initially being diagnosed with atrial fibrillation recently after presenting with symptomatic palpitations and fatgiue. The patient reports increasing frequency and duration of atrial arrhythmias since that time.  The patient has failed medical therapy with flecainide.  The patient therefore presents today for catheter ablation of atrial fibrillation and atrial flutter.  DESCRIPTION OF PROCEDURE:  Informed written consent was obtained, and the patient was brought to the electrophysiology lab in a fasting state.  The patient was adequately sedated with intravenous medications as outlined in the anesthesia report.  The patient's left and right groins were prepped and draped in the usual sterile fashion by the EP lab staff.  Using a percutaneous Seldinger technique, two 7-French and one 8-French hemostasis sheaths were placed into the right common femoral vein.  A 4- [place] hemostasis sheath was placed in the right common femoral artery for blood pressure monitoring.  An 11-French  hemostasis sheath was placed into the left common femoral vein.   3 Dimensional Rotational Angiography: A 5 french pigtail catheter was introduced through the right common femoral vein and advanced into the inferior venocava.  3 demential rotational angiography was then performed by power injection of 100cc of nonionic contrast.  Reprocessing at an independent work station was then performed.   This demonstrated a large sized left atrium with 4 separate pulmonary veins.  The right superior and inferior pulmonary veins were large.  The left superior and left inferior pulmonary veins were moderate in size.  There were no anomalous veins or significant abnormalities.  A 3 dimensional rendering of the left atrium was then merged using [employer] onto the WellPoint system and registered with intracardiac echo (see below).  The pigtail catheter was then removed.  Catheter Placement:  A 7-French Biosense Webster Decapolar coronary sinus catheter was introduced through the right common femoral vein and advanced into the coronary sinus for recording and pacing from this location.  A 6-French quadripolar Josephson catheter was introduced through the right common femoral vein and advanced into the right ventricle for recording and pacing.  This catheter was then pulled back to the His bundle location.    Initial Measurements: The patient presented to the electrophysiology lab in typical appearing atrial flutter.  The atrial flutter cycle length was with 2:1 AV conduction and a ventricular rate of 150 bpm.  The coronary sinus activation sequence revealed proximal to distal activation and was suggestive of right atrial flutter.  The QRS duringation was .  The  HV interval measured 44 msec.    3D Mapping and Ablation of atrial flutter: Entrainment from the left atrium revealed a long post pacing interval.  Entrainment mapping from the cavotricuspid isthmus revealed a PPI equal to the  tachycardia cycle  length.  I therefore elected to perform cavotricuspid isthmus ablation.  A 3.5 mm Biosense Webster EZ Steer Thermocool ablation catheter was advanced into the right atrium.   3D Mapping along the atrial side of the isthmus was performed.  This demonstrated a standard isthmus.  A series of radiofrequency applications were then delivered along the isthmus. The tachycardia slowed and then terminated during ablation.   Complete bidirectional cavotricuspid isthmus block was achieved as confirmed by differential atrial pacing from the low lateral right atrium.  A stimulus to earliest atrial activation across the isthmus measured 160 msec bi-directionally.  The patient was observe without return of conduction through the isthmus.   Intracardiac Echocardiography: A 10-French Biosense Webster AcuNav intracardiac echocardiography catheter was introduced through the left common femoral vein and advanced into the right atrium. Intracardiac echocardiography was performed of the left atrium, and a three-dimensional anatomical rendering of the left atrium was performed using CARTO sound technology.  This demonstrated a large sized left atrium with 4 separate pulmonary veins.  The right superior and inferior pulmonary veins were large.  The left superior and left inferior pulmonary veins were moderate in size.  The left atrial appendage was visualized and did not reveal thrombus.   There was no evidence of pulmonary vein stenosis.   Transseptal Puncture: The middle right common femoral vein sheath was exchanged for an 8.5 [place] SL2 transseptal sheath and transseptal access was achieved in a standard fashion using a Brockenbrough needle under biplane fluoroscopy with intracardiac echocardiography confirmation of the transseptal puncture.  Once transseptal access had been achieved, heparin was administered intravenously and intra- arterially in order to maintain an ACT of greater than 350 seconds throughout  the procedure.   3D Mapping and Ablation of Atrial fibrillation: The transseptal sheath was pulled back into the IVC over a guidewire.  The Thermacool ablation catheter was advanced across the transseptal hole using the wire as a guide.  The transseptal sheath was then re-advanced over the guidewire into the left atrium.  A duodecapolar Biosense Webster circular mapping catheter was introduced through the transseptal sheath and positioned over the mouth of all 4 pulmonary veins.  Three-dimensional electroanatomical mapping was performed using CARTO technology.  This demonstrated electrical activity within all four pulmonary veins at baseline. The patient underwent successful sequential electrical isolation and anatomical encircling of all four pulmonary veins using radiofrequency current with a circular mapping catheter as a guide.   The circular mapping catheter was pulled back into the right atrium and 3D mapping was performed at the junction of the superior vena cava and right atrium.  Electrical activity was observed within the SVC.  I therefore elected to perform right atrial ablation in this area.  A series of radiofrequency applications were delivered in a circular fashion around the ostium of the SVC.  Prior to each ablation lesions, pacing was performed from the distal ablation electrode to insure that diaphragmatic stimulation was not observed to avoid phrenic nerve injury.  Diaphragmatic excursion was also observed during ablation.     Measurements Following Ablation: Following ablation, Isuprel was infused up to 20 mcg/min with no inducible atrial fibrillation, atrial tachycardia, atrial flutter, or sustained PACs. In sinus rhythm with RR interval was 942, with PR , QRS 118 msec, and Qtc 427 msec.  Following ablation the AH interval measured with an HV interval of 41 msec. Ventricular pacing was performed, which revealed midline decremental VA conduction with a VA Wenckebach cycle  length of 390 msec.  Rapid atrial pacing was performed, which revealed an AV Wenckebach cycle length of 340 msec.  Electroisolation was then again confirmed in all four pulmonary veins.  The procedure was therefore considered completed.  All catheters were removed, and the sheaths were aspirated and flushed.  The patient was transferred to the recovery area for sheath removal per protocol.  A limited bedside transthoracic echocardiogram revealed no pericardial effusion.  There were no early apparent complications.  CONCLUSIONS: 1. Isthmus dependant right atrial flutter upon presentation successfully ablated on the usual cavotricuspid isthmus with complete bidirectional isthmus block achieved.   2. Rotational Angiography reveals a large sized left atrium with four separate pulmonary veins without evidence of pulmonary vein stenosis. 3. Successful electrical isolation and anatomical encircling of all four pulmonary veins with radiofrequency current. 4. SVC isolation performed at the SVC/RA junction.  5. No inducible arrhythmias following ablation both on and off of Isuprel 6. No early apparent complications.   Larose Batres,MD 3:52 PM 12/06/2011

## 2011-12-06 NOTE — Progress Notes (Signed)
Addended by: Reine Just on: 12/06/2011 07:50 PM   Modules accepted: Orders

## 2011-12-06 NOTE — Anesthesia Procedure Notes (Signed)
Procedure Name: LMA Insertion Date/Time: 12/06/2011 12:18 PM Performed by: Sharlene Dory E Pre-anesthesia Checklist: Patient identified, Emergency Drugs available, Suction available, Patient being monitored and Timeout performed Patient Re-evaluated:Patient Re-evaluated prior to inductionOxygen Delivery Method: Circle system utilized Preoxygenation: Pre-oxygenation with 100% oxygen Intubation Type: IV induction Ventilation: Mask ventilation without difficulty LMA: LMA with gastric port inserted LMA Size: 4.0 Number of attempts: 1 Placement Confirmation: positive ETCO2 and breath sounds checked- equal and bilateral Tube secured with: Tape Dental Injury: Teeth and Oropharynx as per pre-operative assessment

## 2011-12-06 NOTE — Progress Notes (Signed)
Patient transported to cath lab for ablation.

## 2011-12-06 NOTE — CV Procedure (Signed)
    TRANSESOPHAGEAL ECHOCARDIOGRAM   NAME:  Wyatt Duran   MRN: [id number] DOB:  [birth year]   ADMIT DATE: 12/06/2011  INDICATIONS:  AFIB, pre-ablation   PROCEDURE:   Informed consent was obtained prior to the procedure. The risks, benefits and alternatives for the procedure were discussed and the patient comprehended these risks.  Risks include, but are not limited to, cough, sore throat, vomiting, nausea, somnolence, esophageal and stomach trauma or perforation, bleeding, low blood pressure, aspiration, pneumonia, infection, trauma to the teeth and death.    After a procedural time-out, the patient was given 6 mg versed and 50 mcg fentanyl for moderate sedation.  The oropharynx was anesthetized cetacaine spray.  The transesophageal probe was inserted in the esophagus and stomach without difficulty and multiple views were obtained.    COMPLICATIONS:    There were no immediate complications.  FINDINGS:  LEFT VENTRICLE: EF = 40-45%. Global HK (hard to assess in setting of rapid AF)  RIGHT VENTRICLE: Normal  LEFT ATRIUM: Moderately dilated. Transverse diameter 5.5 cm  LEFT ATRIAL APPENDAGE: No clot  RIGHT ATRIUM: Mildly dilated  AORTIC VALVE:  Trileaflet. Triv AI.   MITRAL VALVE:   Normal. Mild MR.  TRICUSPID VALVE: Normal. Mild TR.  PULMONIC VALVE: Not well visualized  INTERATRIAL SEPTUM: Small PFO with Left to Right shunting at rest  PERICARDIUM: Normal  DESCENDING AORTA: Mild plaque

## 2011-12-06 NOTE — Interval H&P Note (Signed)
History and Physical Interval Note:  12/06/2011 11:25 AM  Wyatt Duran  has presented today for surgery, with the diagnosis of afib  The various methods of treatment have been discussed with the patient and family. After consideration of risks, benefits and other options for treatment, the patient has consented to  Procedure(s) (LRB): ATRIAL FIBRILLATION ABLATION (N/A) as a surgical intervention .  The patients' history has been reviewed, patient examined, no change in status, stable for surgery.  I have reviewed the patients' chart and labs.  Questions were answered to the patient's satisfaction.     Hillis Range

## 2011-12-06 NOTE — H&P (View-Only) (Signed)
PCP:  WESTERMANN,CAROLA JO, MD, MD Primary Cardiologist:  Dr Tilley  The patient presents today for routine electrophysiology followup.  He is tolerating xarelto without problems.  He has fatigue and recently progressive SOB with afib. He underwent DCC on flecainide last week by Dr Tilley.  Though he initially converted to sinus, he returned to afib within minutes.  He is in atrial flutter today. Today, he denies symptoms of palpitations, chest pain, shortness of breath, orthopnea, PND, lower extremity edema, dizziness, presyncope, syncope, or neurologic sequela.  The patient feels that he is tolerating medications without difficulties and is otherwise without complaint today.   Past Medical History  Diagnosis Date  . Syncope 2012    negative workup  . Lumbar disc disease   . Atrial flutter 3 /13  . Atrial fibrillation   . DJD (degenerative joint disease)    Past Surgical History  Procedure Date  . Laparoscopic cholecystectomy   . Lumbar laminectomy   . Knee surgery   . Appendectomy   . Back surgery   . Cardioversion 11/30/2011    Procedure: CARDIOVERSION;  Surgeon: William S Tilley, MD;  Location: [facility] OR;  Service: Cardiovascular;  Laterality: N/A;    Current Outpatient Prescriptions  Medication Sig Dispense Refill  . Rivaroxaban 20 MG TABS Take 20 mg by mouth daily.      . TAZTIA XT 360 MG 24 hr capsule Take 360 mg by mouth daily.         No Known Allergies  History   Social History  . Marital Status: Married    Spouse Name: N/A    Number of Children: N/A  . Years of Education: N/A   Occupational History  . Not on file.   Social History Main Topics  . Smoking status: Never Smoker   . Smokeless tobacco: Never Used  . Alcohol Use: Yes     couple of glasses of wine per week  . Drug Use: No  . Sexually Active: Not on file   Other Topics Concern  . Not on file   Social History Narrative   Pt lives in [place].  Consultant in textile color management.     Family  History  Problem Relation Age of Onset  . Coronary artery disease Father 75    h/o heavy ETOH   ROS- all systems reviewed and negative except as per HPI  Physical Exam: Filed Vitals:   12/05/11 1130  BP: 102/88  Pulse: 148  Height: 6' 1" (1.854 m)  Weight: 240 lb (108.863 kg)    GEN- The patient is well appearing, alert and oriented x 3 today.   Head- normocephalic, atraumatic Eyes-  Sclera clear, conjunctiva pink Ears- hearing intact Oropharynx- clear Neck- supple, no JVP Lymph- no cervical lymphadenopathy Lungs- Clear to ausculation bilaterally, normal work of breathing Heart- irregular rate and rhythm, no murmurs, rubs or gallops, PMI not laterally displaced GI- soft, NT, ND, + BS Extremities- no clubbing, cyanosis, or edema MS- no significant deformity or atrophy Skin- no rash or lesion Psych- euthymic mood, full affect Neuro- strength and sensation are intact  ekg today reveals typical appearing atrial flutter with incomplete RBBB 2:1 AV conduction at 150 bpm  Assessment and Plan:  

## 2011-12-07 ENCOUNTER — Encounter (HOSPITAL_COMMUNITY): Payer: Self-pay | Admitting: Internal Medicine

## 2011-12-07 ENCOUNTER — Telehealth: Payer: Self-pay | Admitting: Internal Medicine

## 2011-12-07 DIAGNOSIS — I4891 Unspecified atrial fibrillation: Secondary | ICD-10-CM

## 2011-12-07 DIAGNOSIS — I079 Rheumatic tricuspid valve disease, unspecified: Secondary | ICD-10-CM | POA: Diagnosis not present

## 2011-12-07 DIAGNOSIS — I4892 Unspecified atrial flutter: Secondary | ICD-10-CM | POA: Diagnosis not present

## 2011-12-07 DIAGNOSIS — I059 Rheumatic mitral valve disease, unspecified: Secondary | ICD-10-CM | POA: Diagnosis not present

## 2011-12-07 DIAGNOSIS — M1990 Unspecified osteoarthritis, unspecified site: Secondary | ICD-10-CM | POA: Diagnosis not present

## 2011-12-07 DIAGNOSIS — Q211 Atrial septal defect: Secondary | ICD-10-CM | POA: Diagnosis not present

## 2011-12-07 DIAGNOSIS — Q2111 Secundum atrial septal defect: Secondary | ICD-10-CM | POA: Diagnosis not present

## 2011-12-07 LAB — BASIC METABOLIC PANEL
CO2: 26 mEq/L (ref 19–32)
Chloride: 106 mEq/L (ref 96–112)
Creatinine, Ser: 1.17 mg/dL (ref 0.50–1.35)

## 2011-12-07 MED ORDER — PANTOPRAZOLE SODIUM 40 MG PO TBEC: DELAYED_RELEASE_TABLET | ORAL | Status: DC

## 2011-12-07 MED ORDER — FLECAINIDE ACETATE 100 MG PO TABS: 100.0000 mg | ORAL_TABLET | Freq: Two times a day (BID) | ORAL | Status: DC

## 2011-12-07 MED ORDER — DILTIAZEM HCL ER BEADS 240 MG PO CP24: ORAL_CAPSULE | ORAL | Status: DC

## 2011-12-07 MED ORDER — DILTIAZEM HCL ER COATED BEADS 240 MG PO CP24
240.0000 mg | ORAL_CAPSULE | Freq: Every day | ORAL | Status: DC
  Filled 2011-12-07: qty 1

## 2011-12-07 NOTE — Telephone Encounter (Signed)
I spoke with pt and wife. Pt was discharged today after ablation. He states he was breathing without difficulty in the hospital. He is at home and the last few hours pt states "it hurts to breath". States he is able to shallow-breath. "It just hurts".   He is not in respiratory distress according to pt. Dr. Ladona Ridgel reviewed and recommended pt take 400mg  Motrin now and repeat in 6 hours.  Pt is resting in the recliner at home and will not perform any strenuous activity.  He/wife will call office in the morning with an update.  Dr. Ladona Ridgel is contacting Dr. Johney Frame and pt aware office will call if further recommendations are made If condition worsens experiences respiratory distress they will activate EMS. Mylo Red RN

## 2011-12-07 NOTE — Progress Notes (Signed)
   SUBJECTIVE: The patient is doing well today s/p afib ablation.  At this time, he denies chest pain, shortness of breath, or any new concerns.     . bupivacaine      . diltiazem  240 mg Oral Daily  . flecainide  100 mg Oral Q12H  . hydroxyurea      . rivaroxaban  20 mg Oral Q supper  . sodium chloride  3 mL Intravenous Q12H  . DISCONTD: fentaNYL  250 mcg Intravenous Once  . DISCONTD: midazolam  10 mg Intravenous Once  . DISCONTD: Rivaroxaban  20 mg Oral q1800  . DISCONTD: sodium chloride  3 mL Intravenous Q12H      . DISCONTD: sodium chloride      OBJECTIVE: Physical Exam: Filed Vitals:   12/06/11 2300 12/07/11 0000 12/07/11 0400 12/07/11 0730  BP: 108/65  108/67 108/72  Pulse: 80     Temp:  98.5 F (36.9 C) 98.6 F (37 C)   TempSrc:  Oral Oral   Resp: 13  13 16   Height:      Weight:      SpO2: 93%  96%     Intake/Output Summary (Last 24 hours) at 12/07/11 0802 Last data filed at 12/07/11 0000  Gross per 24 hour  Intake   1740 ml  Output    550 ml  Net   1190 ml    Telemetry reveals sinus rhythm with pacs  GEN- The patient is well appearing, alert and oriented x 3 today.   Head- normocephalic, atraumatic Eyes-  Sclera clear, conjunctiva pink Ears- hearing intact Oropharynx- clear Neck- supple, no JVP Lymph- no cervical lymphadenopathy Lungs- Clear to ausculation bilaterally, normal work of breathing Heart- Regular rate and rhythm, no murmurs, rubs or gallops, PMI not laterally displaced GI- soft, NT, ND, + BS Extremities- no clubbing, cyanosis, or edema, no hematoma/ bruits Neuro- strength and sensation are intact  LABS: Basic Metabolic Panel:  Basename 12/07/11 0450 12/05/11 1210  NA 140 142  K 4.4 4.0  CL 106 108  CO2 26 26  GLUCOSE 135* 90  BUN 23 16  CREATININE 1.17 1.0  CALCIUM 8.6 9.5  MG -- --  PHOS -- --   Liver Function Tests: No results found for this basename: AST:2,ALT:2,ALKPHOS:2,BILITOT:2,PROT:2,ALBUMIN:2 in the last 72  hours No results found for this basename: LIPASE:2,AMYLASE:2 in the last 72 hours CBC:  Basename 12/05/11 1210  WBC 6.4  NEUTROABS 4.0  HGB 15.1  HCT 45.7  MCV 90.9  PLT 225.0   Cardiac Enzymes: No results found for this basename: CKTOTAL:3,CKMB:3,CKMBINDEX:3,TROPONINI:3 in the last 72 hours BNP: No components found with this basename: POCBNP:3 D-Dimer: No results found for this basename: DDIMER:2 in the last 72 hours Hemoglobin A1C: No results found for this basename: HGBA1C in the last 72 hours Fasting Lipid Panel: No results found for this basename: CHOL,HDL,LDLCALC,TRIG,CHOLHDL,LDLDIRECT in the last 72 hours Thyroid Function Tests: No results found for this basename: TSH,T4TOTAL,FREET3,T3FREE,THYROIDAB in the last 72 hours Anemia Panel: No results found for this basename: VITAMINB12,FOLATE,FERRITIN,TIBC,IRON,RETICCTPCT in the last 72 hours  RADIOLOGY: No results found.  ASSESSMENT AND PLAN:  Active Problems:  Atrial flutter  Tachycardia-bradycardia  Atrial fibrillation, chronic  1. Afib/ atrial flutter- doing well s/p ablation Resume home medicines, decrease cardizem to 240mg  daily Add PPI x 6 weeks Continue xarelto Follow-up with me in 12 weeks DC to home today.   Hillis Range, MD 12/07/2011 8:02 AM

## 2011-12-07 NOTE — Discharge Instructions (Signed)
Keep procedure site clean & dry. If you notice increased pain, swelling, bleeding or pus, call or return!  You may shower, but no soaking baths/hot tubs/pools for 1 week. No driving for 3 days. No lifting over 5 lbs for 1 week.  

## 2011-12-07 NOTE — Progress Notes (Signed)
Patient discharged to home. IV's removed, patient education completed. Patient verbalizes understanding of discharge instructions.

## 2011-12-07 NOTE — Discharge Summary (Signed)
ELECTROPHYSIOLOGY DISCHARGE SUMMARY    Patient ID: Wyatt Duran,  MRN: [id number], DOB/AGE: 11/12/1945 66 y.o.  Admit date: 12/06/2011 Discharge date: 12/07/2011  Primary Care Physician: Gerri Spore, MD Primary Cardiologist: Donnie Aho, MD  Primary Discharge Diagnosis:  1.  Atrial fibrillation s/p RF ablation  Secondary Discharge Diagnoses:  1.  Atrial flutter 2.  DJD  Procedures This Admission:  1. Transesophageal echocardiogram 12/06/2011 -  Left ventricle - EF = 40-45%. Global HK (hard to assess in setting of rapid AF). Right ventricle: Normal. Left atrium: Moderately dilated. Transverse diameter 5.5 cm. Left atrial appendage: No clot. Right atrium: Mildly dilated  Aortic valve: Trileaflet. Triv AI. Mitral valve: Normal. Mild MR. Tricuspid valve: Normal. Mild TR. Pulmonic valve: Not well visualized. Interatrial septum: Small PFO with Left to Right shunting at rest. Pericardium: Normal. Descending aorta: Mild plaque 2.  EP study with RF ablation of AF 12/06/2011 -  Comprehensive electrophysiologic study. Coronary sinus pacing and recording. Three-dimensional mapping of atrial fibrillation and also atrial flutter. Ablation of atrial fibrillation and also atrial flutter. Arterial blood pressure monitoring. Intracardiac echocardiography. Transseptal puncture of an intact septum. Rotational Angiography with processing at an independent workstation. Arrhythmia induction with pacing with isuprel infusion. Conclusions: Isthmus dependant right atrial flutter upon presentation, successfully ablated on the usual cavotricuspid isthmus with complete bidirectional isthmus block achieved. Rotational Angiography reveals a large sized left atrium with four separate pulmonary veins without evidence of pulmonary vein stenosis. Successful electrical isolation and anatomical encircling of all four pulmonary veins with radiofrequency current. SVC isolation performed at the SVC/RA junction. No inducible  arrhythmias following ablation both on and off of Isuprel. No early apparent complications.  History and Hospital Course:  Wyatt Duran is a 66 year old man with persistent AF who has experienced progressive SOB and fatigue with his AF. He underwent DCCV on flecainide last week with Dr. Donnie Aho which was successful for conversion to SR; however, he reverted to AF within a few minutes. He was seen in consultation by Dr. Johney Frame to discuss further treatment options and after a detailed discussion, Mr. Abee elected to proceed with an EP study and RF ablation of AF. On 12/06/2011 he underwent pre-procedural TEE which ruled out LAA thrombus. He then underwent a comprehensive EP study and RF ablation for AF. Please see details as outlined above. He remains hemodynamically stable and in SR. He is ambulating without difficulty. He has been seen, examined and deemed stable for discharge today by Dr. Hillis Range.  Discharge Vitals: Blood pressure 108/72, pulse 74, temperature 99.4 F (37.4 C), temperature source Oral, resp. rate 16, height 6\' 1"  (1.854 m), weight 242 lb 4.6 oz (109.9 kg), SpO2 99.00%.   Labs: Lab Results  Component Value Date   WBC 6.4 12/05/2011   HGB 15.1 12/05/2011   HCT 45.7 12/05/2011   MCV 90.9 12/05/2011   PLT 225.0 12/05/2011    Lab 12/07/11 0450  NA 140  K 4.4  CL 106  CO2 26  BUN 23  CREATININE 1.17  CALCIUM 8.6  PROT --  BILITOT --  ALKPHOS --  ALT --  AST --  GLUCOSE 135*    Disposition:  The patient is being discharged in stable condition.  Follow-up:  Follow up with Hillis Range, MD on 03/08/2012. (At 10:15 AM)    Contact information:   [address] [place] [address] [phone]   Discharge Medications:   TAKE these medications     diltiazem 240 MG  24 hr capsule   Commonly known as: TIAZAC   Take 1 tablet (240 mg) by mouth once daily.      flecainide 100 MG tablet   Commonly known as: TAMBOCOR   Take 1 tablet (100 mg total)  by mouth every 12 (twelve) hours.      pantoprazole 40 MG tablet   Commonly known as: PROTONIX   Take 1 tablet (40 mg) by mouth once daily for 6 weeks.      Rivaroxaban 20 MG Tabs   Take 20 mg by mouth daily.      Duration of Discharge Encounter: Greater than 30 minutes including physician time.  Signed, Rick Duff, PA-C 12/07/2011, 8:35 AM   I have seen, examined the patient, and reviewed the above assessment and plan.   Co Sign: Hillis Range, MD 12/07/2011 12:16 PM

## 2011-12-07 NOTE — Telephone Encounter (Signed)
Pt has ablation yesterday and today he is hurting in his chest every time he breaths and his groin site hurts as well

## 2011-12-07 NOTE — Telephone Encounter (Signed)
I have discussed with Dr Ladona Ridgel. I agree with the present management.  I will have my nurse check on him in the morning.

## 2011-12-08 ENCOUNTER — Telehealth: Payer: Self-pay | Admitting: Internal Medicine

## 2011-12-08 NOTE — Telephone Encounter (Signed)
Spoke with pt this a.m. and he states that he is 100% better today, had a good nights sleep and has no difficulty or painful breathing.

## 2011-12-08 NOTE — Telephone Encounter (Signed)
New msg Pt had an ablation done on Tuesday and he now has a fever-101.5. Please call

## 2011-12-08 NOTE — Telephone Encounter (Signed)
Advised pt to take motrin for fever and to activate EMS for SOB, difficulty swallowing, or chest pain.  Pt agrees.

## 2011-12-08 NOTE — Telephone Encounter (Signed)
Spoke with pt and he states that his temperature has been 100.5, not 101.5 as previously reported.

## 2012-01-09 ENCOUNTER — Ambulatory Visit: Payer: Medicare Other | Admitting: Internal Medicine

## 2012-01-09 DIAGNOSIS — I4891 Unspecified atrial fibrillation: Secondary | ICD-10-CM | POA: Diagnosis not present

## 2012-03-08 ENCOUNTER — Ambulatory Visit (INDEPENDENT_AMBULATORY_CARE_PROVIDER_SITE_OTHER): Payer: Medicare Other | Admitting: Internal Medicine

## 2012-03-08 ENCOUNTER — Encounter: Payer: Self-pay | Admitting: Internal Medicine

## 2012-03-08 VITALS — BP 126/80 | HR 58 | Ht 73.0 in | Wt 234.0 lb

## 2012-03-08 DIAGNOSIS — I4891 Unspecified atrial fibrillation: Secondary | ICD-10-CM | POA: Diagnosis not present

## 2012-03-08 DIAGNOSIS — I4819 Other persistent atrial fibrillation: Secondary | ICD-10-CM

## 2012-03-08 NOTE — Progress Notes (Signed)
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  Since having his afib ablation, the patient reports doing very well.  He denies procedure related complications.  He reports ERAF for about 2 weeks following ablation but has had no symptomatic arrhythmias since that time.  He is very pleased with results at this time.  Today, he denies symptoms of chest pain, shortness of breath,  lower extremity edema, dizziness, presyncope, or syncope.  The patient is otherwise without complaint today.   Past Medical History  Diagnosis Date  . Syncope 2012    negative workup  . Lumbar disc disease   . DJD (degenerative joint disease)   . Atrial flutter 3 /13  . Atrial fibrillation    Past Surgical History  Procedure Date  . Laparoscopic cholecystectomy   . Lumbar laminectomy   . Knee surgery   . Appendectomy   . Back surgery   . Cardioversion 11/30/2011    Procedure: CARDIOVERSION;  Surgeon: Othella Boyer, MD;  Location: [facility] OR;  Service: Cardiovascular;  Laterality: N/A;  . Tee without cardioversion 12/06/2011    Procedure: TRANSESOPHAGEAL ECHOCARDIOGRAM (TEE);  Surgeon: Dolores Patty, MD;  Location: [facility] ENDOSCOPY;  Service: Cardiovascular;  Laterality: N/A;  . Tee without cardioversion 12/06/2011    Procedure: TRANSESOPHAGEAL ECHOCARDIOGRAM (TEE);  Surgeon: Dolores Patty, MD;  Location: [facility] ENDOSCOPY;  Service: Cardiovascular;  Laterality: N/A;  . Atrial fibrillation and atrial flutter ablation 12/06/11    PVI and CTI ablation by Dr Johney Frame    Current Outpatient Prescriptions  Medication Sig Dispense Refill  . diltiazem (TIAZAC) 240 MG 24 hr capsule Take 1 tablet (240 mg) by mouth once daily.  30 capsule  3  . flecainide (TAMBOCOR) 100 MG tablet Take 1 tablet (100 mg total) by mouth every 12 (twelve) hours.  60 tablet  6  . Rivaroxaban 20 MG TABS Take 20 mg by mouth daily.         Physical Exam: Filed Vitals:   03/08/12 1024  BP: 126/80  Pulse: 58  Height: 6\' 1"  (1.854 m)  Weight: 234 lb (106.142 kg)    GEN- The patient is well appearing, alert and oriented x 3 today.   Head- normocephalic, atraumatic Eyes-  Sclera clear, conjunctiva pink Ears- hearing intact Oropharynx- clear Lungs- Clear to ausculation bilaterally, normal work of breathing Heart- Regular rate and rhythm, no murmurs, rubs or gallops, PMI not laterally displaced GI- soft, NT, ND, + BS Extremities- no clubbing, cyanosis, or edema  ekg today reveals sinus rhythm 58 bpm, incomplete RBBB  Assessment and Plan:

## 2012-03-08 NOTE — Patient Instructions (Addendum)
Your physician wants you to follow-up in: 3 months with Dr Johney Frame Wyatt Duran will receive a reminder letter in the mail two months in advance. If you don't receive a letter, please call our office to schedule the follow-up appointment.    Your physician has recommended you make the following change in your medication:  1) Stop Flecainide 2) Stop Xarelto in 6 weeks

## 2012-03-08 NOTE — Assessment & Plan Note (Signed)
Maintaining sinus rhythm Stop flecainide today  If he remains in sinus rhythm in 6 weeks then he should stop xarleto  Return in 3 months.  If no further arrhythmias at that point then we should stop cardizem

## 2012-04-23 ENCOUNTER — Other Ambulatory Visit (HOSPITAL_COMMUNITY): Payer: Self-pay | Admitting: Cardiology

## 2012-05-02 DIAGNOSIS — Z23 Encounter for immunization: Secondary | ICD-10-CM | POA: Diagnosis not present

## 2012-06-13 ENCOUNTER — Ambulatory Visit (INDEPENDENT_AMBULATORY_CARE_PROVIDER_SITE_OTHER): Payer: Medicare Other | Admitting: Internal Medicine

## 2012-06-13 ENCOUNTER — Encounter: Payer: Self-pay | Admitting: Internal Medicine

## 2012-06-13 VITALS — BP 130/76 | HR 74 | Ht 73.0 in | Wt 237.0 lb

## 2012-06-13 DIAGNOSIS — I4819 Other persistent atrial fibrillation: Secondary | ICD-10-CM

## 2012-06-13 DIAGNOSIS — I4891 Unspecified atrial fibrillation: Secondary | ICD-10-CM

## 2012-06-13 DIAGNOSIS — I4892 Unspecified atrial flutter: Secondary | ICD-10-CM | POA: Diagnosis not present

## 2012-06-13 NOTE — Progress Notes (Signed)
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  Since last being seen in our clinic, the patient reports doing very well. He has had no afib off of AAD therapy.  Today, he denies symptoms of palpitations, chest pain, shortness of breath,  lower extremity edema, dizziness, presyncope, or syncope.  The patient is otherwise without complaint today.   Past Medical History  Diagnosis Date  . Syncope 2012    negative workup  . Lumbar disc disease   . DJD (degenerative joint disease)   . Atrial flutter 3 /13  . Atrial fibrillation    Past Surgical History  Procedure Date  . Laparoscopic cholecystectomy   . Lumbar laminectomy   . Knee surgery   . Appendectomy   . Back surgery   . Cardioversion 11/30/2011    Procedure: CARDIOVERSION;  Surgeon: Othella Boyer, MD;  Location: [facility] OR;  Service: Cardiovascular;  Laterality: N/A;  . Tee without cardioversion 12/06/2011    Procedure: TRANSESOPHAGEAL ECHOCARDIOGRAM (TEE);  Surgeon: Dolores Patty, MD;  Location: [facility] ENDOSCOPY;  Service: Cardiovascular;  Laterality: N/A;  . Tee without cardioversion 12/06/2011    Procedure: TRANSESOPHAGEAL ECHOCARDIOGRAM (TEE);  Surgeon: Dolores Patty, MD;  Location: [facility] ENDOSCOPY;  Service: Cardiovascular;  Laterality: N/A;  . Atrial fibrillation and atrial flutter ablation 12/06/11    PVI and CTI ablation by Dr Johney Frame    No current outpatient prescriptions on file.    Physical Exam: Filed Vitals:   06/13/12 0920  BP: 130/76  Pulse: 74  Height: 6\' 1"  (1.854 m)  Weight: 237 lb (107.502 kg)    GEN- The patient is well appearing, alert and oriented x 3 today.   Head- normocephalic, atraumatic Eyes-  Sclera clear, conjunctiva pink Ears- hearing intact Oropharynx- clear Lungs- Clear to ausculation bilaterally, normal work of breathing Heart- Regular rate and rhythm, no murmurs, rubs or gallops,  PMI not laterally displaced GI- soft, NT, ND, + BS Extremities- no clubbing, cyanosis, or edema  ekg today reveals sinus rhythm, RBBB  Assessment and Plan:  1. afib No recurrence off of AAD post ablation CHADS2 score is 0.  He has stopped xarelto We will stop cardizem today.  He will contact me if he has afib.  Keep routine follow-up with Dr Donnie Aho I will see again in 6 months   Fayrene Fearing Sevanna Ballengee,MD

## 2012-06-13 NOTE — Patient Instructions (Signed)
Your physician wants you to follow-up in: 6 months with Dr Johney Frame. You will receive a reminder letter in the mail two months in advance. If you don't receive a letter, please call our office to schedule the follow-up appointment.  Your physician has recommended you make the following change in your medication: STOP Diltiazem

## 2012-07-09 DIAGNOSIS — I4892 Unspecified atrial flutter: Secondary | ICD-10-CM | POA: Diagnosis not present

## 2012-07-09 DIAGNOSIS — R55 Syncope and collapse: Secondary | ICD-10-CM | POA: Diagnosis not present

## 2012-07-09 DIAGNOSIS — K219 Gastro-esophageal reflux disease without esophagitis: Secondary | ICD-10-CM | POA: Diagnosis not present

## 2012-07-09 DIAGNOSIS — I4891 Unspecified atrial fibrillation: Secondary | ICD-10-CM | POA: Diagnosis not present

## 2012-07-09 DIAGNOSIS — E669 Obesity, unspecified: Secondary | ICD-10-CM | POA: Diagnosis not present

## 2012-11-28 DIAGNOSIS — D045 Carcinoma in situ of skin of trunk: Secondary | ICD-10-CM | POA: Diagnosis not present

## 2012-11-28 DIAGNOSIS — L570 Actinic keratosis: Secondary | ICD-10-CM | POA: Diagnosis not present

## 2012-11-28 DIAGNOSIS — D235 Other benign neoplasm of skin of trunk: Secondary | ICD-10-CM | POA: Diagnosis not present

## 2012-11-28 DIAGNOSIS — Z85820 Personal history of malignant melanoma of skin: Secondary | ICD-10-CM | POA: Diagnosis not present

## 2012-12-07 DIAGNOSIS — Z125 Encounter for screening for malignant neoplasm of prostate: Secondary | ICD-10-CM | POA: Diagnosis not present

## 2012-12-07 DIAGNOSIS — Z0000 Encounter for general adult medical examination without abnormal findings: Secondary | ICD-10-CM | POA: Diagnosis not present

## 2012-12-07 DIAGNOSIS — Z23 Encounter for immunization: Secondary | ICD-10-CM | POA: Diagnosis not present

## 2012-12-10 ENCOUNTER — Ambulatory Visit: Payer: Medicare Other | Admitting: Internal Medicine

## 2012-12-12 ENCOUNTER — Encounter: Payer: Self-pay | Admitting: Internal Medicine

## 2012-12-12 ENCOUNTER — Ambulatory Visit (INDEPENDENT_AMBULATORY_CARE_PROVIDER_SITE_OTHER): Payer: Medicare Other | Admitting: Internal Medicine

## 2012-12-12 VITALS — BP 119/76 | HR 71 | Ht 73.0 in | Wt 229.8 lb

## 2012-12-12 DIAGNOSIS — I4891 Unspecified atrial fibrillation: Secondary | ICD-10-CM

## 2012-12-12 DIAGNOSIS — I4819 Other persistent atrial fibrillation: Secondary | ICD-10-CM

## 2012-12-12 NOTE — Progress Notes (Signed)
PCP: Wyatt Confer, MD Primary Cardiologist:  Wyatt Wyatt Duran is a 67 y.o. male who presents today for routine electrophysiology followup.  Since last being seen in our clinic, the patient reports doing very well. He has had no afib off of AAD therapy.  Today, he denies symptoms of palpitations, chest pain, shortness of breath,  lower extremity edema, dizziness, presyncope, or syncope.  The patient is otherwise without complaint today.   Past Medical History  Diagnosis Date  . Syncope 2012    negative workup  . Lumbar disc disease   . DJD (degenerative joint disease)   . Atrial flutter 3 /13  . Atrial fibrillation    Past Surgical History  Procedure Laterality Date  . Laparoscopic cholecystectomy    . Lumbar laminectomy    . Knee surgery    . Appendectomy    . Back surgery    . Cardioversion  11/30/2011    Procedure: CARDIOVERSION;  Surgeon: Wyatt Boyer, MD;  Location: [facility] OR;  Service: Cardiovascular;  Laterality: N/A;  . Tee without cardioversion  12/06/2011    Procedure: TRANSESOPHAGEAL ECHOCARDIOGRAM (TEE);  Surgeon: Wyatt Patty, MD;  Location: [facility] ENDOSCOPY;  Service: Cardiovascular;  Laterality: N/A;  . Tee without cardioversion  12/06/2011    Procedure: TRANSESOPHAGEAL ECHOCARDIOGRAM (TEE);  Surgeon: Wyatt Patty, MD;  Location: [facility] ENDOSCOPY;  Service: Cardiovascular;  Laterality: N/A;  . Atrial fibrillation and atrial flutter ablation  12/06/11    PVI and CTI ablation by Wyatt Wyatt Duran    Current Outpatient Prescriptions  Medication Sig Dispense Refill  . aspirin 81 MG tablet Take 81 mg by mouth daily.       No current facility-administered medications for this visit.    Physical Exam: Filed Vitals:   12/12/12 0841  BP: 119/76  Pulse: 71  Height: 6\' 1"  (1.854 m)  Weight: 229 lb 12.8 oz (104.237 kg)    GEN- The patient is well appearing, alert and oriented x 3 today.   Head- normocephalic, atraumatic Eyes-  Sclera clear, conjunctiva  pink Ears- hearing intact Oropharynx- clear Lungs- Clear to ausculation bilaterally, normal work of breathing Heart- Regular rate and rhythm, no murmurs, rubs or gallops, PMI not laterally displaced GI- soft, NT, ND, + BS Extremities- no clubbing, cyanosis, or edema  ekg today reveals sinus rhythm 72 bpm, incomplete RBBB  Assessment and Plan:  1. afib No recurrence off of AAD post ablation CHADS2VASC score is 1.   No indication for anticoagulation at this time He will contact me if he has afib.  Keep routine follow-up with Wyatt Duran I will see again in 12 months   Wyatt Fearing Pecolia Marando,MD

## 2012-12-12 NOTE — Patient Instructions (Addendum)
Your physician wants you to follow-up 12 months with Dr Jacquiline Doe will receive a reminder letter in the mail two months in advance. If you don't receive a letter, please call our office to schedule the follow-up appointment.

## 2013-01-10 DIAGNOSIS — I4892 Unspecified atrial flutter: Secondary | ICD-10-CM | POA: Diagnosis not present

## 2013-01-10 DIAGNOSIS — I4891 Unspecified atrial fibrillation: Secondary | ICD-10-CM | POA: Diagnosis not present

## 2013-01-10 DIAGNOSIS — K219 Gastro-esophageal reflux disease without esophagitis: Secondary | ICD-10-CM | POA: Diagnosis not present

## 2013-01-10 DIAGNOSIS — R55 Syncope and collapse: Secondary | ICD-10-CM | POA: Diagnosis not present

## 2013-01-10 DIAGNOSIS — E669 Obesity, unspecified: Secondary | ICD-10-CM | POA: Diagnosis not present

## 2013-01-15 DIAGNOSIS — H2510 Age-related nuclear cataract, unspecified eye: Secondary | ICD-10-CM | POA: Diagnosis not present

## 2013-02-06 ENCOUNTER — Other Ambulatory Visit: Payer: Self-pay

## 2013-03-25 DIAGNOSIS — H2510 Age-related nuclear cataract, unspecified eye: Secondary | ICD-10-CM | POA: Diagnosis not present

## 2013-03-25 DIAGNOSIS — H18419 Arcus senilis, unspecified eye: Secondary | ICD-10-CM | POA: Diagnosis not present

## 2013-03-25 DIAGNOSIS — H25049 Posterior subcapsular polar age-related cataract, unspecified eye: Secondary | ICD-10-CM | POA: Diagnosis not present

## 2013-03-25 DIAGNOSIS — H02839 Dermatochalasis of unspecified eye, unspecified eyelid: Secondary | ICD-10-CM | POA: Diagnosis not present

## 2013-03-25 DIAGNOSIS — H25019 Cortical age-related cataract, unspecified eye: Secondary | ICD-10-CM | POA: Diagnosis not present

## 2013-04-15 DIAGNOSIS — H269 Unspecified cataract: Secondary | ICD-10-CM | POA: Diagnosis not present

## 2013-04-15 DIAGNOSIS — H2510 Age-related nuclear cataract, unspecified eye: Secondary | ICD-10-CM | POA: Diagnosis not present

## 2013-04-16 DIAGNOSIS — H2510 Age-related nuclear cataract, unspecified eye: Secondary | ICD-10-CM | POA: Diagnosis not present

## 2013-04-17 DIAGNOSIS — Z23 Encounter for immunization: Secondary | ICD-10-CM | POA: Diagnosis not present

## 2013-05-06 DIAGNOSIS — H52209 Unspecified astigmatism, unspecified eye: Secondary | ICD-10-CM | POA: Diagnosis not present

## 2013-05-06 DIAGNOSIS — H2510 Age-related nuclear cataract, unspecified eye: Secondary | ICD-10-CM | POA: Diagnosis not present

## 2013-05-06 DIAGNOSIS — H269 Unspecified cataract: Secondary | ICD-10-CM | POA: Diagnosis not present

## 2013-05-09 ENCOUNTER — Other Ambulatory Visit: Payer: Self-pay

## 2013-07-09 DIAGNOSIS — Z09 Encounter for follow-up examination after completed treatment for conditions other than malignant neoplasm: Secondary | ICD-10-CM | POA: Diagnosis not present

## 2013-07-09 DIAGNOSIS — K5730 Diverticulosis of large intestine without perforation or abscess without bleeding: Secondary | ICD-10-CM | POA: Diagnosis not present

## 2013-07-09 DIAGNOSIS — Z86010 Personal history of colonic polyps: Secondary | ICD-10-CM | POA: Diagnosis not present

## 2013-08-05 DIAGNOSIS — Z0000 Encounter for general adult medical examination without abnormal findings: Secondary | ICD-10-CM | POA: Diagnosis not present

## 2013-08-05 DIAGNOSIS — M79609 Pain in unspecified limb: Secondary | ICD-10-CM | POA: Diagnosis not present

## 2013-08-05 DIAGNOSIS — Z6831 Body mass index (BMI) 31.0-31.9, adult: Secondary | ICD-10-CM | POA: Diagnosis not present

## 2013-08-05 DIAGNOSIS — Z1331 Encounter for screening for depression: Secondary | ICD-10-CM | POA: Diagnosis not present

## 2013-08-05 DIAGNOSIS — L0291 Cutaneous abscess, unspecified: Secondary | ICD-10-CM | POA: Diagnosis not present

## 2013-08-05 DIAGNOSIS — L0390 Cellulitis, unspecified: Secondary | ICD-10-CM | POA: Diagnosis not present

## 2013-08-05 DIAGNOSIS — E669 Obesity, unspecified: Secondary | ICD-10-CM | POA: Diagnosis not present

## 2013-09-06 DIAGNOSIS — C44519 Basal cell carcinoma of skin of other part of trunk: Secondary | ICD-10-CM | POA: Diagnosis not present

## 2013-09-06 DIAGNOSIS — Z85820 Personal history of malignant melanoma of skin: Secondary | ICD-10-CM | POA: Diagnosis not present

## 2013-09-06 DIAGNOSIS — D235 Other benign neoplasm of skin of trunk: Secondary | ICD-10-CM | POA: Diagnosis not present

## 2013-09-06 DIAGNOSIS — L570 Actinic keratosis: Secondary | ICD-10-CM | POA: Diagnosis not present

## 2013-11-11 DIAGNOSIS — H43819 Vitreous degeneration, unspecified eye: Secondary | ICD-10-CM | POA: Diagnosis not present

## 2013-11-11 DIAGNOSIS — Z961 Presence of intraocular lens: Secondary | ICD-10-CM | POA: Diagnosis not present

## 2013-12-11 DIAGNOSIS — E669 Obesity, unspecified: Secondary | ICD-10-CM | POA: Diagnosis not present

## 2013-12-11 DIAGNOSIS — E785 Hyperlipidemia, unspecified: Secondary | ICD-10-CM | POA: Diagnosis not present

## 2013-12-11 DIAGNOSIS — Z23 Encounter for immunization: Secondary | ICD-10-CM | POA: Diagnosis not present

## 2013-12-11 DIAGNOSIS — Z125 Encounter for screening for malignant neoplasm of prostate: Secondary | ICD-10-CM | POA: Diagnosis not present

## 2013-12-11 DIAGNOSIS — Z0000 Encounter for general adult medical examination without abnormal findings: Secondary | ICD-10-CM | POA: Diagnosis not present

## 2014-01-09 DIAGNOSIS — I4891 Unspecified atrial fibrillation: Secondary | ICD-10-CM | POA: Diagnosis not present

## 2014-01-09 DIAGNOSIS — I4892 Unspecified atrial flutter: Secondary | ICD-10-CM | POA: Diagnosis not present

## 2014-01-09 DIAGNOSIS — K219 Gastro-esophageal reflux disease without esophagitis: Secondary | ICD-10-CM | POA: Diagnosis not present

## 2014-01-09 DIAGNOSIS — R55 Syncope and collapse: Secondary | ICD-10-CM | POA: Diagnosis not present

## 2014-01-09 DIAGNOSIS — E669 Obesity, unspecified: Secondary | ICD-10-CM | POA: Diagnosis not present

## 2014-04-20 ENCOUNTER — Telehealth: Payer: Self-pay | Admitting: Internal Medicine

## 2014-04-20 NOTE — Telephone Encounter (Signed)
I spoke with patient to follow-up on his afib post ablation.  He is very pleased with his results and is having no afib post ablation.  He is not on AAD therapy.  He follows with Dr Wynonia Lawman once per year. I will see as needed.  He is aware that he can call me should he have EP issues in the future.

## 2014-05-08 DIAGNOSIS — H43819 Vitreous degeneration, unspecified eye: Secondary | ICD-10-CM | POA: Diagnosis not present

## 2014-05-08 DIAGNOSIS — Z961 Presence of intraocular lens: Secondary | ICD-10-CM | POA: Diagnosis not present

## 2014-05-12 DIAGNOSIS — Z23 Encounter for immunization: Secondary | ICD-10-CM | POA: Diagnosis not present

## 2014-06-06 DIAGNOSIS — Z961 Presence of intraocular lens: Secondary | ICD-10-CM | POA: Diagnosis not present

## 2014-06-06 DIAGNOSIS — H26491 Other secondary cataract, right eye: Secondary | ICD-10-CM | POA: Diagnosis not present

## 2014-06-06 DIAGNOSIS — H02839 Dermatochalasis of unspecified eye, unspecified eyelid: Secondary | ICD-10-CM | POA: Diagnosis not present

## 2014-06-06 DIAGNOSIS — H18411 Arcus senilis, right eye: Secondary | ICD-10-CM | POA: Diagnosis not present

## 2014-06-12 ENCOUNTER — Encounter (HOSPITAL_COMMUNITY): Payer: Self-pay | Admitting: Internal Medicine

## 2014-06-12 DIAGNOSIS — H26491 Other secondary cataract, right eye: Secondary | ICD-10-CM | POA: Diagnosis not present

## 2014-07-17 ENCOUNTER — Encounter (HOSPITAL_COMMUNITY): Payer: Self-pay | Admitting: Internal Medicine

## 2014-08-05 DIAGNOSIS — Z1283 Encounter for screening for malignant neoplasm of skin: Secondary | ICD-10-CM | POA: Diagnosis not present

## 2014-08-05 DIAGNOSIS — Z08 Encounter for follow-up examination after completed treatment for malignant neoplasm: Secondary | ICD-10-CM | POA: Diagnosis not present

## 2014-08-05 DIAGNOSIS — L820 Inflamed seborrheic keratosis: Secondary | ICD-10-CM | POA: Diagnosis not present

## 2014-08-05 DIAGNOSIS — L570 Actinic keratosis: Secondary | ICD-10-CM | POA: Diagnosis not present

## 2014-08-05 DIAGNOSIS — Z85820 Personal history of malignant melanoma of skin: Secondary | ICD-10-CM | POA: Diagnosis not present

## 2014-08-05 DIAGNOSIS — X32XXXD Exposure to sunlight, subsequent encounter: Secondary | ICD-10-CM | POA: Diagnosis not present

## 2014-09-18 DIAGNOSIS — L720 Epidermal cyst: Secondary | ICD-10-CM | POA: Diagnosis not present

## 2014-09-18 DIAGNOSIS — L02212 Cutaneous abscess of back [any part, except buttock]: Secondary | ICD-10-CM | POA: Diagnosis not present

## 2014-11-10 DIAGNOSIS — H43819 Vitreous degeneration, unspecified eye: Secondary | ICD-10-CM | POA: Diagnosis not present

## 2014-11-10 DIAGNOSIS — Z961 Presence of intraocular lens: Secondary | ICD-10-CM | POA: Diagnosis not present

## 2015-01-09 DIAGNOSIS — K219 Gastro-esophageal reflux disease without esophagitis: Secondary | ICD-10-CM | POA: Diagnosis not present

## 2015-01-09 DIAGNOSIS — I480 Paroxysmal atrial fibrillation: Secondary | ICD-10-CM | POA: Diagnosis not present

## 2015-01-09 DIAGNOSIS — E668 Other obesity: Secondary | ICD-10-CM | POA: Diagnosis not present

## 2015-02-11 DIAGNOSIS — E785 Hyperlipidemia, unspecified: Secondary | ICD-10-CM | POA: Diagnosis not present

## 2015-02-11 DIAGNOSIS — E669 Obesity, unspecified: Secondary | ICD-10-CM | POA: Diagnosis not present

## 2015-02-11 DIAGNOSIS — Z0000 Encounter for general adult medical examination without abnormal findings: Secondary | ICD-10-CM | POA: Diagnosis not present

## 2015-02-11 DIAGNOSIS — K219 Gastro-esophageal reflux disease without esophagitis: Secondary | ICD-10-CM | POA: Diagnosis not present

## 2015-06-15 DIAGNOSIS — Z23 Encounter for immunization: Secondary | ICD-10-CM | POA: Diagnosis not present

## 2015-11-09 DIAGNOSIS — Z961 Presence of intraocular lens: Secondary | ICD-10-CM | POA: Diagnosis not present

## 2015-11-09 DIAGNOSIS — H1132 Conjunctival hemorrhage, left eye: Secondary | ICD-10-CM | POA: Diagnosis not present

## 2015-11-09 DIAGNOSIS — H26492 Other secondary cataract, left eye: Secondary | ICD-10-CM | POA: Diagnosis not present

## 2015-12-11 DIAGNOSIS — H26492 Other secondary cataract, left eye: Secondary | ICD-10-CM | POA: Diagnosis not present

## 2015-12-11 DIAGNOSIS — H02839 Dermatochalasis of unspecified eye, unspecified eyelid: Secondary | ICD-10-CM | POA: Diagnosis not present

## 2015-12-11 DIAGNOSIS — Z961 Presence of intraocular lens: Secondary | ICD-10-CM | POA: Diagnosis not present

## 2015-12-18 DIAGNOSIS — M19072 Primary osteoarthritis, left ankle and foot: Secondary | ICD-10-CM | POA: Diagnosis not present

## 2015-12-21 ENCOUNTER — Encounter (INDEPENDENT_AMBULATORY_CARE_PROVIDER_SITE_OTHER): Payer: Medicare Other | Admitting: Ophthalmology

## 2015-12-21 DIAGNOSIS — H35373 Puckering of macula, bilateral: Secondary | ICD-10-CM

## 2015-12-21 DIAGNOSIS — H43813 Vitreous degeneration, bilateral: Secondary | ICD-10-CM | POA: Diagnosis not present

## 2015-12-21 DIAGNOSIS — H26492 Other secondary cataract, left eye: Secondary | ICD-10-CM | POA: Diagnosis not present

## 2016-01-15 DIAGNOSIS — Z08 Encounter for follow-up examination after completed treatment for malignant neoplasm: Secondary | ICD-10-CM | POA: Diagnosis not present

## 2016-01-15 DIAGNOSIS — D225 Melanocytic nevi of trunk: Secondary | ICD-10-CM | POA: Diagnosis not present

## 2016-01-15 DIAGNOSIS — K219 Gastro-esophageal reflux disease without esophagitis: Secondary | ICD-10-CM | POA: Diagnosis not present

## 2016-01-15 DIAGNOSIS — L858 Other specified epidermal thickening: Secondary | ICD-10-CM | POA: Diagnosis not present

## 2016-01-15 DIAGNOSIS — X32XXXD Exposure to sunlight, subsequent encounter: Secondary | ICD-10-CM | POA: Diagnosis not present

## 2016-01-15 DIAGNOSIS — L570 Actinic keratosis: Secondary | ICD-10-CM | POA: Diagnosis not present

## 2016-01-15 DIAGNOSIS — Z1283 Encounter for screening for malignant neoplasm of skin: Secondary | ICD-10-CM | POA: Diagnosis not present

## 2016-01-15 DIAGNOSIS — Z85820 Personal history of malignant melanoma of skin: Secondary | ICD-10-CM | POA: Diagnosis not present

## 2016-01-15 DIAGNOSIS — E668 Other obesity: Secondary | ICD-10-CM | POA: Diagnosis not present

## 2016-01-15 DIAGNOSIS — I480 Paroxysmal atrial fibrillation: Secondary | ICD-10-CM | POA: Diagnosis not present

## 2016-01-15 DIAGNOSIS — D485 Neoplasm of uncertain behavior of skin: Secondary | ICD-10-CM | POA: Diagnosis not present

## 2016-02-16 DIAGNOSIS — E785 Hyperlipidemia, unspecified: Secondary | ICD-10-CM | POA: Diagnosis not present

## 2016-02-16 DIAGNOSIS — Z1159 Encounter for screening for other viral diseases: Secondary | ICD-10-CM | POA: Diagnosis not present

## 2016-02-16 DIAGNOSIS — Z0000 Encounter for general adult medical examination without abnormal findings: Secondary | ICD-10-CM | POA: Diagnosis not present

## 2016-04-26 DIAGNOSIS — D485 Neoplasm of uncertain behavior of skin: Secondary | ICD-10-CM | POA: Diagnosis not present

## 2016-04-26 DIAGNOSIS — L942 Calcinosis cutis: Secondary | ICD-10-CM | POA: Diagnosis not present

## 2016-04-26 DIAGNOSIS — B078 Other viral warts: Secondary | ICD-10-CM | POA: Diagnosis not present

## 2016-04-26 DIAGNOSIS — Z1283 Encounter for screening for malignant neoplasm of skin: Secondary | ICD-10-CM | POA: Diagnosis not present

## 2016-05-06 DIAGNOSIS — X32XXXD Exposure to sunlight, subsequent encounter: Secondary | ICD-10-CM | POA: Diagnosis not present

## 2016-05-06 DIAGNOSIS — L570 Actinic keratosis: Secondary | ICD-10-CM | POA: Diagnosis not present

## 2016-05-24 DIAGNOSIS — Z23 Encounter for immunization: Secondary | ICD-10-CM | POA: Diagnosis not present

## 2016-09-19 ENCOUNTER — Ambulatory Visit (INDEPENDENT_AMBULATORY_CARE_PROVIDER_SITE_OTHER): Payer: Medicare Other | Admitting: Ophthalmology

## 2016-09-19 DIAGNOSIS — H35341 Macular cyst, hole, or pseudohole, right eye: Secondary | ICD-10-CM

## 2016-09-19 DIAGNOSIS — H35371 Puckering of macula, right eye: Secondary | ICD-10-CM | POA: Diagnosis not present

## 2016-09-19 DIAGNOSIS — H43813 Vitreous degeneration, bilateral: Secondary | ICD-10-CM | POA: Diagnosis not present

## 2016-10-14 DIAGNOSIS — S99921A Unspecified injury of right foot, initial encounter: Secondary | ICD-10-CM | POA: Diagnosis not present

## 2016-10-14 DIAGNOSIS — G8911 Acute pain due to trauma: Secondary | ICD-10-CM | POA: Diagnosis not present

## 2016-10-14 DIAGNOSIS — M79671 Pain in right foot: Secondary | ICD-10-CM | POA: Diagnosis not present

## 2016-10-31 DIAGNOSIS — M7671 Peroneal tendinitis, right leg: Secondary | ICD-10-CM | POA: Diagnosis not present

## 2016-10-31 DIAGNOSIS — M19072 Primary osteoarthritis, left ankle and foot: Secondary | ICD-10-CM | POA: Diagnosis not present

## 2016-11-08 DIAGNOSIS — M79672 Pain in left foot: Secondary | ICD-10-CM | POA: Diagnosis not present

## 2016-11-08 DIAGNOSIS — M79671 Pain in right foot: Secondary | ICD-10-CM | POA: Diagnosis not present

## 2016-11-11 DIAGNOSIS — M79671 Pain in right foot: Secondary | ICD-10-CM | POA: Diagnosis not present

## 2016-11-11 DIAGNOSIS — M79672 Pain in left foot: Secondary | ICD-10-CM | POA: Diagnosis not present

## 2016-11-17 DIAGNOSIS — M79672 Pain in left foot: Secondary | ICD-10-CM | POA: Diagnosis not present

## 2016-11-17 DIAGNOSIS — M79671 Pain in right foot: Secondary | ICD-10-CM | POA: Diagnosis not present

## 2016-11-21 DIAGNOSIS — M79671 Pain in right foot: Secondary | ICD-10-CM | POA: Diagnosis not present

## 2016-11-21 DIAGNOSIS — M79672 Pain in left foot: Secondary | ICD-10-CM | POA: Diagnosis not present

## 2016-11-24 DIAGNOSIS — M79672 Pain in left foot: Secondary | ICD-10-CM | POA: Diagnosis not present

## 2016-11-24 DIAGNOSIS — M79671 Pain in right foot: Secondary | ICD-10-CM | POA: Diagnosis not present

## 2016-11-29 DIAGNOSIS — M79672 Pain in left foot: Secondary | ICD-10-CM | POA: Diagnosis not present

## 2016-11-29 DIAGNOSIS — M79671 Pain in right foot: Secondary | ICD-10-CM | POA: Diagnosis not present

## 2016-12-01 DIAGNOSIS — M79672 Pain in left foot: Secondary | ICD-10-CM | POA: Diagnosis not present

## 2016-12-01 DIAGNOSIS — M79671 Pain in right foot: Secondary | ICD-10-CM | POA: Diagnosis not present

## 2016-12-06 DIAGNOSIS — M79672 Pain in left foot: Secondary | ICD-10-CM | POA: Diagnosis not present

## 2016-12-06 DIAGNOSIS — M79671 Pain in right foot: Secondary | ICD-10-CM | POA: Diagnosis not present

## 2016-12-08 DIAGNOSIS — M79672 Pain in left foot: Secondary | ICD-10-CM | POA: Diagnosis not present

## 2016-12-08 DIAGNOSIS — M79671 Pain in right foot: Secondary | ICD-10-CM | POA: Diagnosis not present

## 2017-01-13 DIAGNOSIS — E668 Other obesity: Secondary | ICD-10-CM | POA: Diagnosis not present

## 2017-01-13 DIAGNOSIS — I480 Paroxysmal atrial fibrillation: Secondary | ICD-10-CM | POA: Diagnosis not present

## 2017-01-13 DIAGNOSIS — K219 Gastro-esophageal reflux disease without esophagitis: Secondary | ICD-10-CM | POA: Diagnosis not present

## 2017-02-17 DIAGNOSIS — X32XXXD Exposure to sunlight, subsequent encounter: Secondary | ICD-10-CM | POA: Diagnosis not present

## 2017-02-17 DIAGNOSIS — Z85820 Personal history of malignant melanoma of skin: Secondary | ICD-10-CM | POA: Diagnosis not present

## 2017-02-17 DIAGNOSIS — Z08 Encounter for follow-up examination after completed treatment for malignant neoplasm: Secondary | ICD-10-CM | POA: Diagnosis not present

## 2017-02-17 DIAGNOSIS — L570 Actinic keratosis: Secondary | ICD-10-CM | POA: Diagnosis not present

## 2017-02-17 DIAGNOSIS — Z1283 Encounter for screening for malignant neoplasm of skin: Secondary | ICD-10-CM | POA: Diagnosis not present

## 2017-02-22 DIAGNOSIS — Z23 Encounter for immunization: Secondary | ICD-10-CM | POA: Diagnosis not present

## 2017-02-22 DIAGNOSIS — Z5181 Encounter for therapeutic drug level monitoring: Secondary | ICD-10-CM | POA: Diagnosis not present

## 2017-02-22 DIAGNOSIS — E785 Hyperlipidemia, unspecified: Secondary | ICD-10-CM | POA: Diagnosis not present

## 2017-02-22 DIAGNOSIS — Z0000 Encounter for general adult medical examination without abnormal findings: Secondary | ICD-10-CM | POA: Diagnosis not present

## 2017-03-01 ENCOUNTER — Encounter: Payer: Self-pay | Admitting: Vascular Surgery

## 2017-03-10 ENCOUNTER — Encounter: Payer: Self-pay | Admitting: Vascular Surgery

## 2017-03-13 ENCOUNTER — Encounter: Payer: Self-pay | Admitting: Vascular Surgery

## 2017-03-13 ENCOUNTER — Ambulatory Visit (INDEPENDENT_AMBULATORY_CARE_PROVIDER_SITE_OTHER): Payer: Medicare Other | Admitting: Vascular Surgery

## 2017-03-13 VITALS — BP 139/89 | HR 63 | Temp 97.1°F | Resp 20 | Ht 73.0 in | Wt 234.0 lb

## 2017-03-13 DIAGNOSIS — I83892 Varicose veins of left lower extremities with other complications: Secondary | ICD-10-CM

## 2017-03-13 NOTE — Progress Notes (Signed)
Subjective:     Patient ID: Wyatt Duran, male   DOB: 07-Aug-1945, 71 y.o.   MRN: [id number]  HPI This 71 year old male was referred by Dr. Maurice Small for evaluation of painful varicosities in the left leg. Patient has had enlarging varicose veins for 10+ years in the left leg extending down into the thigh and lateral calf area. He has no history of DVT thrombophlebitis stasis ulcers or bleeding. He does not were elastic compression stockings. He does develop some swelling as the day progresses left worse than right. The varicosities continue to enlarge and he is concerned about long term ramifications of this. He does desire treatment.  Past Medical History:  Diagnosis Date  . Atrial fibrillation ([place])   . Atrial flutter ([place]) 3 /13  . DJD (degenerative joint disease)   . Lumbar disc disease   . Syncope 2012   negative workup    Social History  Substance Use Topics  . Smoking status: Never Smoker  . Smokeless tobacco: Never Used  . Alcohol use Yes     Comment: couple of glasses of wine per week    Family History  Problem Relation Age of Onset  . Coronary artery disease Father 32       h/o heavy ETOH    No Known Allergies   Current Outpatient Prescriptions:  .  aspirin 81 MG tablet, Take 81 mg by mouth daily., Disp: , Rfl:   Vitals:   03/13/17 0950 03/13/17 0951  BP: (!) 144/63 139/89  Pulse: 63   Resp: 20   Temp: (!) 97.1 F (36.2 C)   TempSrc: Oral   SpO2: 97%   Weight: 234 lb (106.1 kg)   Height: 6\' 1"  (1.854 m)     Body mass index is 30.87 kg/m.         Review of Systems Patient has history of atrial fib which has been treated with ablation by Dr. all read patient does not currently take any anticoagulants other than daily aspirin. Also history of GERD. Denies chest pain, dyspnea on exertion, PND, orthopnea, hemoptysis    Objective:   Physical Exam BP 139/89 (BP Location: Left Arm, Patient Position: Sitting, Cuff Size: Normal) Comment: recheck   Pulse 63   Temp (!) 97.1 F (36.2 C) (Oral)   Resp 20   Ht 6\' 1"  (1.854 m)   Wt 234 lb (106.1 kg)   SpO2 97%   BMI 30.87 kg/m     Gen.-alert and oriented x3 in no apparent distress HEENT normal for age Lungs no rhonchi or wheezing Cardiovascular regular rhythm no murmurs carotid pulses 3+ palpable no bruits audible Abdomen soft nontender no palpable masses Musculoskeletal free of  major deformities Skin clear -no rashes Neurologic normal Lower extremities 3+ femoral and dorsalis pedis pulses palpable bilaterally with no edema on the right trace edema on the left Large extensive bulging varicosities beginning in the left medial thigh extending anteriorly across the patella into the lateral calf and down toward the lateral malleolus. Also extensive reticular and spider veins with other bulging varicosities in the distal medial calf over the great saphenous system. No ulceration noted.  Today I performed a bedside SonoSite ultrasound exam. The left great saphenous vein is grossly enlarged with reflux throughout supplying these painful varicosities. The right great saphenous vein appears normal in caliber       Assessment:     Painful varicosities left leg due to gross reflux left great saphenous vein causing symptoms which are  affecting patient's daily living #2 history of atrial fibrillation treated with ablation successfully 5 years ago #3 GERD    Plan:         #1 long leg elastic compression stockings 20-30 mm gradient #2 elevate legs as much as possible #3 ibuprofen daily on a regular basis for pain #4 return in 3 months-he will have formal venous reflux exam of the left leg upon return and I will make a formal recommendation. It appears that he will need laser ablation left great saphenous vein followed by three-month waiting period and then greater than 20 stab phlebectomy of painful varicosities will be considered Return in 3 months

## 2017-03-17 NOTE — Addendum Note (Signed)
Addended by: Lianne Cure A on: 03/17/2017 04:51 PM   Modules accepted: Orders

## 2017-04-26 DIAGNOSIS — H35341 Macular cyst, hole, or pseudohole, right eye: Secondary | ICD-10-CM | POA: Diagnosis not present

## 2017-05-02 DIAGNOSIS — I4891 Unspecified atrial fibrillation: Secondary | ICD-10-CM | POA: Diagnosis not present

## 2017-05-02 DIAGNOSIS — R930 Abnormal findings on diagnostic imaging of skull and head, not elsewhere classified: Secondary | ICD-10-CM | POA: Diagnosis not present

## 2017-05-02 DIAGNOSIS — R231 Pallor: Secondary | ICD-10-CM | POA: Diagnosis not present

## 2017-05-02 DIAGNOSIS — R739 Hyperglycemia, unspecified: Secondary | ICD-10-CM | POA: Diagnosis not present

## 2017-05-02 DIAGNOSIS — G9389 Other specified disorders of brain: Secondary | ICD-10-CM | POA: Diagnosis not present

## 2017-05-02 DIAGNOSIS — R112 Nausea with vomiting, unspecified: Secondary | ICD-10-CM | POA: Diagnosis not present

## 2017-05-02 DIAGNOSIS — R531 Weakness: Secondary | ICD-10-CM | POA: Diagnosis not present

## 2017-05-02 DIAGNOSIS — R0602 Shortness of breath: Secondary | ICD-10-CM | POA: Diagnosis not present

## 2017-05-02 DIAGNOSIS — H8110 Benign paroxysmal vertigo, unspecified ear: Secondary | ICD-10-CM | POA: Diagnosis not present

## 2017-05-02 DIAGNOSIS — R42 Dizziness and giddiness: Secondary | ICD-10-CM | POA: Diagnosis not present

## 2017-05-02 DIAGNOSIS — Z7982 Long term (current) use of aspirin: Secondary | ICD-10-CM | POA: Diagnosis not present

## 2017-05-02 DIAGNOSIS — Z79899 Other long term (current) drug therapy: Secondary | ICD-10-CM | POA: Diagnosis not present

## 2017-05-03 DIAGNOSIS — R42 Dizziness and giddiness: Secondary | ICD-10-CM | POA: Diagnosis not present

## 2017-05-03 DIAGNOSIS — R739 Hyperglycemia, unspecified: Secondary | ICD-10-CM | POA: Diagnosis not present

## 2017-05-10 DIAGNOSIS — Z09 Encounter for follow-up examination after completed treatment for conditions other than malignant neoplasm: Secondary | ICD-10-CM | POA: Diagnosis not present

## 2017-05-10 DIAGNOSIS — H8110 Benign paroxysmal vertigo, unspecified ear: Secondary | ICD-10-CM | POA: Diagnosis not present

## 2017-05-10 DIAGNOSIS — Z6832 Body mass index (BMI) 32.0-32.9, adult: Secondary | ICD-10-CM | POA: Diagnosis not present

## 2017-06-19 ENCOUNTER — Other Ambulatory Visit: Payer: Self-pay

## 2017-06-19 ENCOUNTER — Ambulatory Visit (HOSPITAL_COMMUNITY)
Admission: RE | Admit: 2017-06-19 | Discharge: 2017-06-19 | Disposition: A | Discharge status: Home / Self Care | Payer: Medicare Other | Source: Ambulatory Visit | Attending: Vascular Surgery | Admitting: Vascular Surgery

## 2017-06-19 ENCOUNTER — Ambulatory Visit (INDEPENDENT_AMBULATORY_CARE_PROVIDER_SITE_OTHER): Payer: Medicare Other | Admitting: Vascular Surgery

## 2017-06-19 ENCOUNTER — Encounter: Payer: Self-pay | Admitting: Vascular Surgery

## 2017-06-19 VITALS — BP 122/85 | HR 62 | Temp 97.6°F | Resp 18 | Ht 73.0 in | Wt 230.0 lb

## 2017-06-19 DIAGNOSIS — I83892 Varicose veins of left lower extremities with other complications: Secondary | ICD-10-CM

## 2017-06-19 NOTE — Progress Notes (Signed)
Subjective:     Patient ID: Wyatt Duran, male   DOB: 11-06-1945, 71 y.o.   MRN: [id number]  HPI This 71 year old male returns for 3 month follow-up regarding his painful varicosities in the left leg. He is tried long-leg elastic compression stockings 20-30 millimeter gradient as well as elevation and ibuprofen but continues to have aching and throbbing discomfort which worsens as the day progresses. He also developed swelling. Symptoms are affecting his daily living and he would like treatment.  Past Medical History:  Diagnosis Date  . Atrial fibrillation ([place])   . Atrial flutter ([place]) 3 /13  . DJD (degenerative joint disease)   . Lumbar disc disease   . Syncope 2012   negative workup    Social History   Tobacco Use  . Smoking status: Never Smoker  . Smokeless tobacco: Never Used  Substance Use Topics  . Alcohol use: Yes    Comment: couple of glasses of wine per week    Family History  Problem Relation Age of Onset  . Coronary artery disease Father 16       h/o heavy ETOH    No Known Allergies   Current Outpatient Medications:  .  aspirin 81 MG tablet, Take 81 mg by mouth daily., Disp: , Rfl:   There were no vitals filed for this visit.  There is no height or weight on file to calculate BMI.         Review of Systems He does have a history of atrial fibrillation treated by ablation therapy 5 years ago successfully and currently takes daily aspirin-denies chest pain, dyspnea on exertion, PND, orthopnea    Objective:   Physical Exam BP 122/85 (BP Location: Left Arm, Patient Position: Sitting, Cuff Size: Large)   Pulse 62   Temp 97.6 F (36.4 C) (Oral)   Resp 18   Ht 6\' 1"  (1.854 m)   Wt 230 lb (104.3 kg)   SpO2 98%   BMI 30.34 kg/m   Gen. well-developed well-nourished male no apparent distress alert and oriented 3 Lungs no rhonchi or wheezing Left leg with bulging varicosities beginning in the mid thigh extending anterolaterally across the knee  and into the lateral calf and posteriorly area trace distal edema noted. No hyperpigmentation or ulceration noted.  Today I ordered a venous duplex exam the left leg which I reviewed and interpreted. There is gross reflux throughout an enlarged left great saphenous vein supplying these painful varicosities with no DVT     Assessment:     Painful varicosities left leg due to gross reflux left great saphenous vein causing symptoms which are resistant to conservative measures including long-leg elastic compression stockings, elevation, and ibuprofen. The symptoms are affecting his daily living and he would like treatment.    Plan:     Patient needs #1 laser ablation left great saphenous vein followed by three-month waiting period and then be evaluated for multiple stab phlebectomy of residual painful varicosities We will proceed with precertification to perform this in the near future and relieve his symptoms

## 2017-07-12 ENCOUNTER — Other Ambulatory Visit: Payer: Self-pay | Admitting: *Deleted

## 2017-07-12 DIAGNOSIS — I83892 Varicose veins of left lower extremities with other complications: Secondary | ICD-10-CM

## 2017-08-08 ENCOUNTER — Ambulatory Visit (INDEPENDENT_AMBULATORY_CARE_PROVIDER_SITE_OTHER): Payer: Medicare Other | Admitting: Vascular Surgery

## 2017-08-08 ENCOUNTER — Encounter: Payer: Self-pay | Admitting: Vascular Surgery

## 2017-08-08 VITALS — BP 130/89 | HR 73 | Temp 97.1°F | Resp 18 | Ht 72.0 in | Wt 230.0 lb

## 2017-08-08 DIAGNOSIS — I83892 Varicose veins of left lower extremities with other complications: Secondary | ICD-10-CM

## 2017-08-08 HISTORY — PX: ENDOVENOUS ABLATION SAPHENOUS VEIN W/ LASER: SUR449

## 2017-08-08 NOTE — Progress Notes (Signed)
Subjective:     Patient ID: Wyatt Duran, male   DOB: Jun 12, 1946, 72 y.o.   MRN: [id number]  HPI This 72 year old male had laser ablation left great saphenous vein performed under local tumescent anesthesia. A total of 2069 J of energy was utilized. He tolerated the procedure well.  Review of Systems     Objective:   Physical Exam BP 130/89 (BP Location: Left Arm, Patient Position: Sitting, Cuff Size: Normal)   Pulse 73   Temp (!) 97.1 F (36.2 C) (Oral)   Resp 18   Ht 6' (1.829 m)   Wt 230 lb (104.3 kg)   SpO2 99%   BMI 31.19 kg/m        Assessment:     Well-tolerated laser ablation left great saphenous vein from proximal calf to near the saphenofemoral junction performed under local tumescent anesthesia    Plan:     Return in 1 week for venous duplex exam confirmed closure left great saphenous vein Patient will then return in 3 months to be evaluated for possible stab phlebectomy of painful varicosities

## 2017-08-08 NOTE — Progress Notes (Signed)
Laser Ablation Procedure    Date: 08/08/2017   Wyatt Duran DOB:[birth year]  Consent signed: Yes    Surgeon:  Dr. Nelda Severe. Kellie Simmering  Procedure: Laser Ablation: left Greater Saphenous Vein  BP 130/89 (BP Location: Left Arm, Patient Position: Sitting, Cuff Size: Normal)   Pulse 73   Temp (!) 97.1 F (36.2 C) (Oral)   Resp 18   Ht 6' (1.829 m)   Wt 230 lb (104.3 kg)   SpO2 99%   BMI 31.19 kg/m   Tumescent Anesthesia: 375 cc 0.9% NaCl with 50 cc Lidocaine HCL 1% and 15 cc 8.4% NaHCO3  Local Anesthesia: 3 cc Lidocaine HCL and NaHCO3 (ratio 2:1)  Pulsed Mode: 15 watts, 524ms delay, 1.0 duration  Total Energy: 2059 Joules             Total Pulses:  138              Total Time:  2:17    Patient tolerated procedure well   Description of Procedure:  After marking the course of the secondary varicosities, the patient was placed on the operating table in the supine position, and the left leg was prepped and draped in sterile fashion.   Local anesthetic was administered and under ultrasound guidance the saphenous vein was accessed with a micro needle and guide wire; then the mirco puncture sheath was placed.  A guide wire was inserted saphenofemoral junction , followed by a 5 french sheath.  The position of the sheath and then the laser fiber below the junction was confirmed using the ultrasound.  Tumescent anesthesia was administered along the course of the saphenous vein using ultrasound guidance. The patient was placed in Trendelenburg position and protective laser glasses were placed on patient and staff, and the laser was fired at 15 watts continuous mode advancing 1-82mm/second for a total of 2059 joules.   Steri strip was applied to the IV insertion siteand ABD pads and thigh high compression stockings were applied.  Ace wrap bandages were applied over the left calf and thigh and at the top of the saphenofemoral junction. Blood loss was less than 15 cc.  The patient ambulated out of the  operating room having tolerated the procedure well.

## 2017-08-22 ENCOUNTER — Other Ambulatory Visit: Payer: Self-pay

## 2017-08-22 ENCOUNTER — Encounter: Payer: Self-pay | Admitting: Vascular Surgery

## 2017-08-22 ENCOUNTER — Ambulatory Visit (HOSPITAL_COMMUNITY)
Admission: RE | Admit: 2017-08-22 | Discharge: 2017-08-22 | Disposition: A | Discharge status: Home / Self Care | Payer: Medicare Other | Source: Ambulatory Visit | Attending: Vascular Surgery | Admitting: Vascular Surgery

## 2017-08-22 ENCOUNTER — Ambulatory Visit (INDEPENDENT_AMBULATORY_CARE_PROVIDER_SITE_OTHER): Payer: Medicare Other | Admitting: Vascular Surgery

## 2017-08-22 VITALS — BP 127/88 | HR 63 | Temp 97.8°F | Resp 18 | Ht 72.0 in | Wt 230.0 lb

## 2017-08-22 DIAGNOSIS — I82812 Embolism and thrombosis of superficial veins of left lower extremities: Secondary | ICD-10-CM | POA: Insufficient documentation

## 2017-08-22 DIAGNOSIS — I83892 Varicose veins of left lower extremities with other complications: Secondary | ICD-10-CM

## 2017-08-22 DIAGNOSIS — Z9889 Other specified postprocedural states: Secondary | ICD-10-CM | POA: Insufficient documentation

## 2017-08-22 NOTE — Progress Notes (Signed)
Subjective:     Patient ID: Wyatt Duran, male   DOB: Sep 04, 1945, 72 y.o.   MRN: [id number]  HPI This 72 year old male returns 2 week follow-up regarding laser ablation left great saphenous vein for gross reflux with painful varicosities. He has taken his ibuprofen as instructed and worn long leg elastic compression stockings 20-30 millimeter gradient. He has had minimal discomfort in the left leg at the laser ablation site. He's had no change in distal edema. He has no specific complaints.  Past Medical History:  Diagnosis Date  . Atrial fibrillation ([place])   . Atrial flutter ([place]) 3 /13  . DJD (degenerative joint disease)   . Lumbar disc disease   . Syncope 2012   negative workup    Social History   Tobacco Use  . Smoking status: Never Smoker  . Smokeless tobacco: Never Used  Substance Use Topics  . Alcohol use: Yes    Comment: couple of glasses of wine per week    Family History  Problem Relation Age of Onset  . Coronary artery disease Father 33       h/o heavy ETOH    No Known Allergies   Current Outpatient Medications:  .  aspirin 81 MG tablet, Take 81 mg by mouth daily., Disp: , Rfl:   Vitals:   08/22/17 1039  BP: 127/88  Pulse: 63  Resp: 18  Temp: 97.8 F (36.6 C)  TempSrc: Oral  SpO2: 96%  Weight: 230 lb (104.3 kg)  Height: 6' (1.829 m)    Body mass index is 31.19 kg/m.         Review of Systems Denies chest pain, dyspnea on exertion, PND, orthopnea, hemoptysis, claudication    Objective:   Physical Exam BP 127/88 (BP Location: Left Arm, Patient Position: Sitting, Cuff Size: Large)   Pulse 63   Temp 97.8 F (36.6 C) (Oral)   Resp 18   Ht 6' (1.829 m)   Wt 230 lb (104.3 kg)   SpO2 96%   BMI 31.19 kg/m   Gen. well-developed well-nourished male no apparent distress alert and oriented 3 Lungs no rhonchi or wheezing Left leg with no discomfort to palpation over great saphenous vein. Bulging varicosities remain in the distal thigh  and across the knee area to the lateral calf. No hyperpigmentation or ulceration noted distally.  Today I ordered a venous duplex exam the left leg which I reviewed and interpreted. There is no DVT. There is successful closure of the great saphenous vein up to near the saphenofemoral junction  Also performed a bedside sono site exam confirming the above findings and there is no thrombus at the saphenofemoral junction     Assessment:     Successful laser ablation left great saphenous vein for gross reflux with painful varicosities    Plan:     Return in 3 months to evaluate for possible stab phlebectomy of residual painful varicosities

## 2017-09-21 ENCOUNTER — Ambulatory Visit (INDEPENDENT_AMBULATORY_CARE_PROVIDER_SITE_OTHER): Payer: Medicare Other | Admitting: Ophthalmology

## 2017-09-25 ENCOUNTER — Ambulatory Visit (INDEPENDENT_AMBULATORY_CARE_PROVIDER_SITE_OTHER): Payer: Medicare Other | Admitting: Ophthalmology

## 2017-09-25 DIAGNOSIS — H43813 Vitreous degeneration, bilateral: Secondary | ICD-10-CM | POA: Diagnosis not present

## 2017-09-25 DIAGNOSIS — H35341 Macular cyst, hole, or pseudohole, right eye: Secondary | ICD-10-CM | POA: Diagnosis not present

## 2017-09-25 DIAGNOSIS — H35371 Puckering of macula, right eye: Secondary | ICD-10-CM | POA: Diagnosis not present

## 2017-11-08 DIAGNOSIS — Z08 Encounter for follow-up examination after completed treatment for malignant neoplasm: Secondary | ICD-10-CM | POA: Diagnosis not present

## 2017-11-08 DIAGNOSIS — Z1283 Encounter for screening for malignant neoplasm of skin: Secondary | ICD-10-CM | POA: Diagnosis not present

## 2017-11-08 DIAGNOSIS — Z85820 Personal history of malignant melanoma of skin: Secondary | ICD-10-CM | POA: Diagnosis not present

## 2017-11-20 ENCOUNTER — Ambulatory Visit (INDEPENDENT_AMBULATORY_CARE_PROVIDER_SITE_OTHER): Payer: Medicare Other | Admitting: Vascular Surgery

## 2017-11-20 ENCOUNTER — Other Ambulatory Visit: Payer: Self-pay

## 2017-11-20 ENCOUNTER — Encounter: Payer: Self-pay | Admitting: Vascular Surgery

## 2017-11-20 VITALS — BP 134/88 | HR 64 | Resp 20 | Ht 72.0 in | Wt 237.0 lb

## 2017-11-20 DIAGNOSIS — I83892 Varicose veins of left lower extremities with other complications: Secondary | ICD-10-CM

## 2017-11-20 NOTE — Progress Notes (Signed)
  Subjective:     Patient ID: Wyatt Duran, male   DOB: 03/05/1946, 72 y.o.   MRN: [id number]  HPI This 72 year old male returns for 50-month follow-up regarding his painful varicosities in the left leg.  He had laser ablation of the left great saphenous vein performed 3 months ago for gross reflux with painful varicosities.  He has worn his elastic compression stockings 20-30 mm gradient but continues to have some aching and throbbing discomfort within the bulging varicosities in the left anterior and lateral thigh.  He would like to have these treated to relieve his symptoms   Past Medical History:  Diagnosis Date  . Atrial fibrillation ([place])   . Atrial flutter ([place]) 3 /13  . DJD (degenerative joint disease)   . Lumbar disc disease   . Syncope 2012   negative workup    Social History   Tobacco Use  . Smoking status: Never Smoker  . Smokeless tobacco: Never Used  Substance Use Topics  . Alcohol use: Yes    Comment: couple of glasses of wine per week    Family History  Problem Relation Age of Onset  . Coronary artery disease Father 59       h/o heavy ETOH    No Known Allergies   Current Outpatient Medications:  .  aspirin 81 MG tablet, Take 81 mg by mouth daily., Disp: , Rfl:   Vitals:   11/20/17 1016  BP: 134/88  Pulse: 64  Resp: 20  SpO2: 97%  Weight: 237 lb (107.5 kg)  Height: 6' (1.829 m)    Body mass index is 32.14 kg/m.         Review of Systems  Denies chest pain, dyspnea on exertion, PND, orthopnea, hemoptysis  Objective:   Physical Exam BP 134/88 (BP Location: Left Arm, Patient Position: Sitting, Cuff Size: Normal)   Pulse 64   Resp 20   Ht 6' (1.829 m)   Wt 237 lb (107.5 kg)   SpO2 97%   BMI 32.14 kg/m   General well-developed well-nourished male no apparent distress alert and oriented x3 Lungs no rhonchi or wheezing Left leg with bulging varicosities beginning in the very distal thigh extending across the patella into the lateral  calf posteriorly.  No edema noted distally.  No hyperpigmentation or ulceration noted.  3+ dorsalis pedis pulse palpable.     Assessment:     Digital painful varicosities left leg status post laser ablation left great saphenous vein for gross reflux with good early result Patient does request completing the treatment with multiple phlebectomy to eliminate his symptoms    Plan:     Plan multiple stab phlebectomy painful varicosities left leg-10-20-to be performed under local anesthesia in the near future to complete his treatment regimen

## 2017-11-28 ENCOUNTER — Encounter: Payer: Self-pay | Admitting: Vascular Surgery

## 2017-11-28 ENCOUNTER — Ambulatory Visit (INDEPENDENT_AMBULATORY_CARE_PROVIDER_SITE_OTHER): Payer: Medicare Other | Admitting: Vascular Surgery

## 2017-11-28 VITALS — BP 108/64 | HR 74 | Temp 98.3°F | Resp 16 | Ht 72.0 in | Wt 230.0 lb

## 2017-11-28 DIAGNOSIS — I83892 Varicose veins of left lower extremities with other complications: Secondary | ICD-10-CM | POA: Diagnosis not present

## 2017-11-28 HISTORY — PX: OTHER SURGICAL HISTORY: SHX169

## 2017-11-28 NOTE — Progress Notes (Signed)
  Subjective:     Patient ID: Wyatt Duran, male   DOB: 1946/06/16, 72 y.o.   MRN: [id number]  HPI This 72 year old male had multiple stab phlebectomy of painful varicosities left leg-10-20-performed under local tumescent anesthesia. He tolerated the procedure well.  Review of Systems     Objective:   Physical Exam BP 108/64 (BP Location: Left Arm, Patient Position: Sitting, Cuff Size: Normal)   Pulse 74   Temp 98.3 F (36.8 C) (Oral)   Resp 16   Ht 6' (1.829 m)   Wt 230 lb (104.3 kg)   SpO2 96%   BMI 31.19 kg/m        Assessment:     Well-tolerated multiple stab phlebectomy painful varicosities left leg performed under local tumescent anesthesia    Plan:     Patient will return on a when necessary basis

## 2017-11-28 NOTE — Progress Notes (Signed)
    Stab Phlebectomy Procedure  Wyatt Duran DOB:1945/12/04  11/28/2017  Consent signed: Yes  Surgeon:J.D. Kellie Simmering, MD  Procedure: stab phlebectomy: left leg  BP 108/64 (BP Location: Left Arm, Patient Position: Sitting, Cuff Size: Normal)   Pulse 74   Temp 98.3 F (36.8 C) (Oral)   Resp 16   Ht 6' (1.829 m)   Wt 230 lb (104.3 kg)   SpO2 96%   BMI 31.19 kg/m   Start time: 2:15PM   End time: 2:45PM   Tumescent Anesthesia: 200 cc 0.9% NaCl with 50 cc Lidocaine HCL 1% and 15 cc 8.4% NaHCO3  Local Anesthesia: 4 cc Lidocaine HCL and NaHCO3 (ratio 2:1)    Stab Phlebectomy: 10-20 Sites: Thigh and Calf  Patient tolerated procedure well: Yes    Description of Procedure:  After marking the course of the secondary varicosities, the patient was placed on the operating table in the supine position, and the left leg was prepped and draped in sterile fashion.    The patient was then put into Trendelenburg position.  Local anesthetic was administered at the previously marked varicosities, and tumescent anesthesia was administered around the vessels.  Ten to 20 stab wounds were made using the tip of an 11 blade. And using the vein hook, the phlebectomies were performed using a hemostat to avulse the varicosities.  Adequate hemostasis was achieved, and steri strips were applied to the stab wound.      ABD pads and thigh high compression stockings were applied as well ace wraps where needed. Blood loss was less than 15 cc.  The patient ambulated out of the operating room having tolerated the procedure well.

## 2018-01-16 DIAGNOSIS — K219 Gastro-esophageal reflux disease without esophagitis: Secondary | ICD-10-CM | POA: Diagnosis not present

## 2018-01-16 DIAGNOSIS — E668 Other obesity: Secondary | ICD-10-CM | POA: Diagnosis not present

## 2018-01-16 DIAGNOSIS — I480 Paroxysmal atrial fibrillation: Secondary | ICD-10-CM | POA: Diagnosis not present

## 2018-01-17 DIAGNOSIS — K449 Diaphragmatic hernia without obstruction or gangrene: Secondary | ICD-10-CM | POA: Diagnosis not present

## 2018-01-17 DIAGNOSIS — Z86010 Personal history of colonic polyps: Secondary | ICD-10-CM | POA: Diagnosis not present

## 2018-01-17 DIAGNOSIS — K219 Gastro-esophageal reflux disease without esophagitis: Secondary | ICD-10-CM | POA: Diagnosis not present

## 2018-03-07 DIAGNOSIS — Z5181 Encounter for therapeutic drug level monitoring: Secondary | ICD-10-CM | POA: Diagnosis not present

## 2018-03-07 DIAGNOSIS — E785 Hyperlipidemia, unspecified: Secondary | ICD-10-CM | POA: Diagnosis not present

## 2018-03-07 DIAGNOSIS — Z23 Encounter for immunization: Secondary | ICD-10-CM | POA: Diagnosis not present

## 2018-03-07 DIAGNOSIS — Z0000 Encounter for general adult medical examination without abnormal findings: Secondary | ICD-10-CM | POA: Diagnosis not present

## 2018-05-16 ENCOUNTER — Ambulatory Visit: Payer: Medicare Other | Admitting: Emergency Medicine

## 2018-05-21 DIAGNOSIS — H5203 Hypermetropia, bilateral: Secondary | ICD-10-CM | POA: Diagnosis not present

## 2018-05-21 DIAGNOSIS — H524 Presbyopia: Secondary | ICD-10-CM | POA: Diagnosis not present

## 2018-05-21 DIAGNOSIS — Z961 Presence of intraocular lens: Secondary | ICD-10-CM | POA: Diagnosis not present

## 2018-05-21 DIAGNOSIS — H35341 Macular cyst, hole, or pseudohole, right eye: Secondary | ICD-10-CM | POA: Diagnosis not present

## 2018-05-21 DIAGNOSIS — Z9849 Cataract extraction status, unspecified eye: Secondary | ICD-10-CM | POA: Diagnosis not present

## 2018-05-21 DIAGNOSIS — H52223 Regular astigmatism, bilateral: Secondary | ICD-10-CM | POA: Diagnosis not present

## 2018-07-06 DIAGNOSIS — K449 Diaphragmatic hernia without obstruction or gangrene: Secondary | ICD-10-CM | POA: Diagnosis not present

## 2018-07-06 DIAGNOSIS — Z86010 Personal history of colonic polyps: Secondary | ICD-10-CM | POA: Diagnosis not present

## 2018-07-06 DIAGNOSIS — K219 Gastro-esophageal reflux disease without esophagitis: Secondary | ICD-10-CM | POA: Diagnosis not present

## 2018-07-30 DIAGNOSIS — Z86010 Personal history of colonic polyps: Secondary | ICD-10-CM | POA: Diagnosis not present

## 2018-07-30 DIAGNOSIS — K219 Gastro-esophageal reflux disease without esophagitis: Secondary | ICD-10-CM | POA: Diagnosis not present

## 2018-07-30 DIAGNOSIS — D122 Benign neoplasm of ascending colon: Secondary | ICD-10-CM | POA: Diagnosis not present

## 2018-07-30 DIAGNOSIS — K449 Diaphragmatic hernia without obstruction or gangrene: Secondary | ICD-10-CM | POA: Diagnosis not present

## 2018-07-30 DIAGNOSIS — K228 Other specified diseases of esophagus: Secondary | ICD-10-CM | POA: Diagnosis not present

## 2018-08-01 DIAGNOSIS — D122 Benign neoplasm of ascending colon: Secondary | ICD-10-CM | POA: Diagnosis not present

## 2018-08-01 DIAGNOSIS — K219 Gastro-esophageal reflux disease without esophagitis: Secondary | ICD-10-CM | POA: Diagnosis not present

## 2018-08-20 DIAGNOSIS — M65332 Trigger finger, left middle finger: Secondary | ICD-10-CM | POA: Diagnosis not present

## 2018-09-26 ENCOUNTER — Encounter (INDEPENDENT_AMBULATORY_CARE_PROVIDER_SITE_OTHER): Payer: Medicare Other | Admitting: Ophthalmology

## 2018-10-23 ENCOUNTER — Other Ambulatory Visit: Payer: Self-pay

## 2018-10-23 ENCOUNTER — Encounter (INDEPENDENT_AMBULATORY_CARE_PROVIDER_SITE_OTHER): Payer: Medicare Other | Admitting: Ophthalmology

## 2018-10-23 DIAGNOSIS — H43813 Vitreous degeneration, bilateral: Secondary | ICD-10-CM

## 2018-10-23 DIAGNOSIS — H35341 Macular cyst, hole, or pseudohole, right eye: Secondary | ICD-10-CM | POA: Diagnosis not present

## 2018-12-13 DIAGNOSIS — Z03818 Encounter for observation for suspected exposure to other biological agents ruled out: Secondary | ICD-10-CM | POA: Diagnosis not present

## 2019-01-21 NOTE — Progress Notes (Signed)
Cardiology Office Note:    Date:  01/22/2019   ID:  JEANCARLO Duran, DOB [birth year], MRN [id number]  PCP:  Maurice Small, MD  Cardiologist:  Shirlee More, MD    Referring MD: Maurice Small, MD    ASSESSMENT:    1. PAF (paroxysmal atrial fibrillation) (HCC)    PLAN:    In order of problems listed above:  1. Atrial fibrillation stable continues to maintain sinus rhythm without antiarrhythmic drug.  He has had a good durable response echo suspected will continue but I told him to be alert for recurrence and if he documents him to contact me I would anticoagulate him and if he had a significant A. fib burden he would need an antiarrhythmic drug.  At this time he exhibits no other cardiovascular illness CAD heart failure hypertension or valvular.  I do not think he requires any cardiac imaging at this time and have lab work repeated with his PCP as part of wellness in September   Next appointment: 1 year   Medication Adjustments/Labs and Tests Ordered: Current medicines are reviewed at length with the patient today.  Concerns regarding medicines are outlined above.  No orders of the defined types were placed in this encounter.  No orders of the defined types were placed in this encounter.   No chief complaint on file.   History of Present Illness:    Wyatt Duran is a 73 y.o. male with a hx of paroxysmal atrial fibrillation with EP catheter ablation 2013.  He was last seen by Dr. Wynonia Lawman 01/16/2018.  He continues to enjoy a good durable response he has had no clinical recurrence and screens himself with the iPhone adapter every 1 to 2 weeks.  He has had no intercurrent illness and has no edema shortness of breath chest pain palpitations syncope or TIA.  He is not anticoagulated he takes low-dose aspirin for antithrombotic therapy I have asked him to screen himself several times a week for atrial fibrillation if it recurs told him to contact me as I would anticoagulate him.  Most  recent lab 0 03/07/2018 cholesterol 148 HDL 48 LDL 84 creatinine and potassium were normal along with liver function.  He is due for his yearly exam September with repeat labs. Compliance with diet, lifestyle and medications: Yes Past Medical History:  Diagnosis Date  . Atrial fibrillation ([place])   . Atrial flutter ([place]) 3 /13  . DJD (degenerative joint disease)   . Lumbar disc disease   . Syncope 2012   negative workup    Past Surgical History:  Procedure Laterality Date  . APPENDECTOMY    . ATRIAL FIBRILLATION ABLATION N/A 12/06/2011   Procedure: ATRIAL FIBRILLATION ABLATION;  Surgeon: Thompson Grayer, MD;  [facility];  Service: Cardiovascular;  Laterality: N/A;  . Atrial fibrillation and Atrial flutter ablation  12/06/11   PVI and CTI ablation by Dr Rayann Heman  . BACK SURGERY    . CARDIOVERSION  11/30/2011   Procedure: CARDIOVERSION;  Surgeon: Jacolyn Reedy, MD;  Location: [place];  Service: Cardiovascular;  Laterality: N/A;  . ENDOVENOUS ABLATION SAPHENOUS VEIN W/ LASER Left 08/08/2017   endovenous laser ablation L greater saphenous vein by Tinnie Gens MD   . [place]    . LAPAROSCOPIC CHOLECYSTECTOMY    . LUMBAR LAMINECTOMY    . stab phlebectomy Left 11/28/2017   stab phlebectomy 10-20 incisions left leg by Tinnie Gens MD   . TEE WITHOUT CARDIOVERSION  12/06/2011  Procedure: TRANSESOPHAGEAL ECHOCARDIOGRAM (TEE);  Surgeon: Jolaine Artist, MD;  Location[facility] ENDOSCOPY;  Service: Cardiovascular;  Laterality: N/A;    Current Medications: No outpatient medications have been marked as taking for the 01/22/19 encounter (Office Visit) with Richardo Priest, MD.     Allergies:   Patient has no known allergies.   Social History   Socioeconomic History  . Marital status: Married    Spouse name: Not on file  . Number of children: Not on file  . Years of education: Not on file  . Highest education level: Not on file  Occupational History  . Not on file  Social Needs  .  Financial resource strain: Not on file  . Food insecurity    Worry: Not on file    Inability: Not on file  . Transportation needs    Medical: Not on file    Non-medical: Not on file  Tobacco Use  . Smoking status: Never Smoker  . Smokeless tobacco: Never Used  Substance and Sexual Activity  . Alcohol use: Yes    Comment: couple of glasses of wine per week  . Drug use: No  . Sexual activity: Not on file  Lifestyle  . Physical activity    Days per week: Not on file    Minutes per session: Not on file  . Stress: Not on file  Relationships  . Social Herbalist on phone: Not on file    Gets together: Not on file    Attends religious service: Not on file    Active member of club or organization: Not on file    Attends meetings of clubs or organizations: Not on file    Relationship status: Not on file  Other Topics Concern  . Not on file  Social History Narrative   Pt lives in [place].  Consultant in Advertising copywriter.      Family History: The patient's family history includes Alcoholism in his father; Atrial fibrillation in his brother and mother; Heart attack in his father and mother. ROS:   Please see the history of present illness.    All other systems reviewed and are negative.  EKGs/Labs/Other Studies Reviewed:    The following studies were reviewed today:  EKG:  EKG ordered today and personally reviewed.  The ekg ordered today demonstrates sinus rhythm PVCs   Physical Exam:    VS:  BP (!) 142/86 (BP Location: Right Arm, Patient Position: Sitting, Cuff Size: Normal)   Pulse 61   Temp 97.7 F (36.5 C)   Ht 6' (1.829 m)   Wt 233 lb (105.7 kg)   SpO2 97%   BMI 31.60 kg/m     Wt Readings from Last 3 Encounters:  01/22/19 233 lb (105.7 kg)  11/28/17 230 lb (104.3 kg)  11/20/17 237 lb (107.5 kg)     GEN:  Well nourished, well developed in no acute distress HEENT: Normal NECK: No JVD; No carotid bruits LYMPHATICS: No lymphadenopathy  CARDIAC: RRR, no murmurs, rubs, gallops RESPIRATORY:  Clear to auscultation without rales, wheezing or rhonchi  ABDOMEN: Soft, non-tender, non-distended MUSCULOSKELETAL:  No edema; No deformity  SKIN: Warm and dry NEUROLOGIC:  Alert and oriented x 3 PSYCHIATRIC:  Normal affect    Signed, Shirlee More, MD  01/22/2019 11:59 AM    [place]

## 2019-01-22 ENCOUNTER — Encounter: Payer: Self-pay | Admitting: Cardiology

## 2019-01-22 ENCOUNTER — Other Ambulatory Visit: Payer: Self-pay

## 2019-01-22 ENCOUNTER — Ambulatory Visit (INDEPENDENT_AMBULATORY_CARE_PROVIDER_SITE_OTHER): Payer: Medicare Other | Admitting: Cardiology

## 2019-01-22 VITALS — BP 142/86 | HR 61 | Temp 97.7°F | Ht 72.0 in | Wt 233.0 lb

## 2019-01-22 DIAGNOSIS — I480 Paroxysmal atrial fibrillation: Secondary | ICD-10-CM

## 2019-01-22 NOTE — Patient Instructions (Signed)
Medication Instructions:  Your physician recommends that you continue on your current medications as directed. Please refer to the Current Medication list given to you today.  If you need a refill on your cardiac medications before your next appointment, please call your pharmacy.   Lab work: None If you have labs (blood work) drawn today and your tests are completely normal, you will receive your results only by: Marland Kitchen MyChart Message (if you have MyChart) OR . A paper copy in the mail If you have any lab test that is abnormal or we need to change your treatment, we will call you to review the results.  Testing/Procedures: None  Follow-Up: At [facility], you and your health needs are our priority.  As part of our continuing mission to provide you with exceptional heart care, we have created designated Provider Care Teams.  These Care Teams include your primary Cardiologist (physician) and Advanced Practice Providers (APPs -  Physician Assistants and Nurse Practitioners) who all work together to provide you with the care you need, when you need it. You will need a follow up appointment in 1 years.  Please call our office 2 months in advance to schedule this appointment.  You may see No primary care provider on file. or another member of our [employer] in [place]: Jenne Campus, MD . Jyl Heinz, MD  Any Other Special Instructions Will Be Listed Below (If Applicable).

## 2019-01-23 NOTE — Addendum Note (Signed)
Addended by: Particia Nearing B on: 01/23/2019 09:31 AM   Modules accepted: Orders

## 2019-01-30 DIAGNOSIS — M65332 Trigger finger, left middle finger: Secondary | ICD-10-CM | POA: Diagnosis not present

## 2019-04-08 DIAGNOSIS — Z5181 Encounter for therapeutic drug level monitoring: Secondary | ICD-10-CM | POA: Diagnosis not present

## 2019-04-08 DIAGNOSIS — Z23 Encounter for immunization: Secondary | ICD-10-CM | POA: Diagnosis not present

## 2019-04-08 DIAGNOSIS — K219 Gastro-esophageal reflux disease without esophagitis: Secondary | ICD-10-CM | POA: Diagnosis not present

## 2019-04-08 DIAGNOSIS — E669 Obesity, unspecified: Secondary | ICD-10-CM | POA: Diagnosis not present

## 2019-04-08 DIAGNOSIS — E785 Hyperlipidemia, unspecified: Secondary | ICD-10-CM | POA: Diagnosis not present

## 2019-04-08 DIAGNOSIS — Z0000 Encounter for general adult medical examination without abnormal findings: Secondary | ICD-10-CM | POA: Diagnosis not present

## 2019-04-08 DIAGNOSIS — I480 Paroxysmal atrial fibrillation: Secondary | ICD-10-CM | POA: Diagnosis not present

## 2019-07-02 ENCOUNTER — Other Ambulatory Visit: Payer: Self-pay | Admitting: Orthopedic Surgery

## 2019-07-03 NOTE — Progress Notes (Signed)
Cardiology Office Note:    Date:  07/04/2019   ID:  Wyatt Duran, DOB [birth year], MRN [id number]  PCP:  Maurice Small, MD  Cardiologist:  Shirlee More, MD    Referring MD: Maurice Small, MD    ASSESSMENT:    1. PAF (paroxysmal atrial fibrillation) ([place])   2. Preoperative cardiovascular examination    PLAN:    In order of problems listed above:  1. Atrial fibrillation paroxysmal no recurrence since remote ablation he can simply stop aspirin a week before surgery his EKG is stable and requires no further preoperative evaluation.   Next appointment: 6 months   Medication Adjustments/Labs and Tests Ordered: Current medicines are reviewed at length with the patient today.  Concerns regarding medicines are outlined above.  No orders of the defined types were placed in this encounter.  No orders of the defined types were placed in this encounter.   Chief Complaint  Patient presents with  . Follow-up  . Atrial Fibrillation    History of Present Illness:    Wyatt Duran is a 73 y.o. male with a hx of paroxysmal atrial fibrillation with EP catheter ablation 2013  last seen 01/22/2019. Compliance with diet, lifestyle and medications: Yes He is scheduled for release trigger finger with regional forearm block Dr. Fredna Dow 07/12/2019  He is instructed to stop his aspirin 1 week before surgery and I asked the surgical team to let them know when he can resume afterwards usually 1 to 2 days.  He has had no recurrent atrial fibrillation is having no symptoms of palpitation edema shortness of breath or chest pain requires no further preoperative cardiovascular evaluation. Past Medical History:  Diagnosis Date  . Atrial fibrillation ([place])   . Atrial flutter ([place]) 3 /13  . DJD (degenerative joint disease)   . Lumbar disc disease   . Syncope 2012   negative workup    Past Surgical History:  Procedure Laterality Date  . APPENDECTOMY    . ATRIAL FIBRILLATION ABLATION N/A  12/06/2011   Procedure: ATRIAL FIBRILLATION ABLATION;  Surgeon: Thompson Grayer, MD;  Location[facility] CATH LAB;  Service: Cardiovascular;  Laterality: N/A;  . Atrial fibrillation and Atrial flutter ablation  12/06/11   PVI and CTI ablation by Dr Rayann Heman  . BACK SURGERY    . CARDIOVERSION  11/30/2011   Procedure: CARDIOVERSION;  Surgeon: Jacolyn Reedy, MD;  Location: [place];  Service: Cardiovascular;  Laterality: N/A;  . ENDOVENOUS ABLATION SAPHENOUS VEIN W/ LASER Left 08/08/2017   endovenous laser ablation L greater saphenous vein by Tinnie Gens MD   . [place]    . LAPAROSCOPIC CHOLECYSTECTOMY    . LUMBAR LAMINECTOMY    . stab phlebectomy Left 11/28/2017   stab phlebectomy 10-20 incisions left leg by Tinnie Gens MD   . TEE WITHOUT CARDIOVERSION  12/06/2011   Procedure: TRANSESOPHAGEAL ECHOCARDIOGRAM (TEE);  Surgeon: Jolaine Artist, MD;  Location[facility] ENDOSCOPY;  Service: Cardiovascular;  Laterality: N/A;    Current Medications: Current Meds  Medication Sig  . aspirin 81 MG tablet Take 81 mg by mouth daily.  Marland Kitchen omeprazole (PRILOSEC) 20 MG capsule Take 1 capsule by mouth every other day.     Allergies:   Patient has no known allergies.   Social History   Socioeconomic History  . Marital status: Married    Spouse name: Not on file  . Number of children: Not on file  . Years of education: Not on file  . Highest education level:  Not on file  Occupational History  . Not on file  Tobacco Use  . Smoking status: Never Smoker  . Smokeless tobacco: Never Used  Substance and Sexual Activity  . Alcohol use: Yes    Comment: couple of glasses of wine per week  . Drug use: No  . Sexual activity: Not on file  Other Topics Concern  . 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    Social Determinants of Health   Financial Resource Strain:   . Difficulty of Paying Living Expenses: Not on file  Food Insecurity:   . Worried  About Charity fundraiser in the Last Year: Not on file  . Ran Out of Food in the Last Year: Not on file  Transportation Needs:   . Lack of Transportation (Medical): Not on file  . Lack of Transportation (Non-Medical): Not on file  Physical Activity:   . Days of Exercise per Week: Not on file  . Minutes of Exercise per Session: Not on file  Stress:   . Feeling of Stress : Not on file  Social Connections:   . Frequency of Communication with Friends and Family: Not on file  . Frequency of Social Gatherings with Friends and Family: Not on file  . Attends Religious Services: Not on file  . Active Member of Clubs or Organizations: Not on file  . Attends Archivist Meetings: Not on file  . Marital Status: Not on file     Family History: The patient's family history includes Alcoholism in his father; Atrial fibrillation in his brother and mother; Heart attack in his father and mother. ROS:   Please see the history of present illness.    All other systems reviewed and are negative.  EKGs/Labs/Other Studies Reviewed:    The following studies were reviewed today:  EKG:  EKG ordered today and personally reviewed.  The ekg ordered today demonstrates sinus rhythm right bundle branch block stable pattern  Recent Labs: No results found for requested labs within last 8760 hours.  Recent Lipid Panel No results found for: CHOL, TRIG, HDL, CHOLHDL, VLDL, LDLCALC, LDLDIRECT  Physical Exam:    VS:  There were no vitals taken for this visit.    Wt Readings from Last 3 Encounters:  01/22/19 233 lb (105.7 kg)  11/28/17 230 lb (104.3 kg)  11/20/17 237 lb (107.5 kg)     GEN:  Well nourished, well developed in no acute distress HEENT: Normal NECK: No JVD; No carotid bruits LYMPHATICS: No lymphadenopathy CARDIAC: RRR, no murmurs, rubs, gallops RESPIRATORY:  Clear to auscultation without rales, wheezing or rhonchi  ABDOMEN: Soft, non-tender, non-distended MUSCULOSKELETAL:  No edema;  No deformity  SKIN: Warm and dry NEUROLOGIC:  Alert and oriented x 3 PSYCHIATRIC:  Normal affect    Signed, Shirlee More, MD  07/04/2019 2:52 PM    [facility]

## 2019-07-04 ENCOUNTER — Encounter: Payer: Self-pay | Admitting: Cardiology

## 2019-07-04 ENCOUNTER — Ambulatory Visit (INDEPENDENT_AMBULATORY_CARE_PROVIDER_SITE_OTHER): Payer: Medicare Other | Admitting: Cardiology

## 2019-07-04 ENCOUNTER — Other Ambulatory Visit: Payer: Self-pay

## 2019-07-04 VITALS — BP 138/80 | HR 64 | Ht 72.0 in | Wt 230.2 lb

## 2019-07-04 DIAGNOSIS — Z01810 Encounter for preprocedural cardiovascular examination: Secondary | ICD-10-CM

## 2019-07-04 DIAGNOSIS — I480 Paroxysmal atrial fibrillation: Secondary | ICD-10-CM

## 2019-07-04 NOTE — Patient Instructions (Addendum)
Medication Instructions:  Your physician recommends that you continue on your current medications as directed. Please refer to the Current Medication list given to you today.  *If you need a refill on your cardiac medications before your next appointment, please call your pharmacy*  Lab Work: NONE If you have labs (blood work) drawn today and your tests are completely normal, you will receive your results only by: Marland Kitchen MyChart Message (if you have MyChart) OR . A paper copy in the mail If you have any lab test that is abnormal or we need to change your treatment, we will call you to review the results.  Testing/Procedures: You had an EKG Performed today  Follow-Up: At [facility], you and your health needs are our priority.  As part of our continuing mission to provide you with exceptional heart care, we have created designated Provider Care Teams.  These Care Teams include your primary Cardiologist (physician) and Advanced Practice Providers (APPs -  Physician Assistants and Nurse Practitioners) who all work together to provide you with the care you need, when you need it.  Your next appointment:   6 month(s)  The format for your next appointment:   In Person  Provider:   Shirlee More, MD   Purchase on line at [facility] or at [facility]

## 2019-07-09 ENCOUNTER — Other Ambulatory Visit (HOSPITAL_COMMUNITY)
Admission: RE | Admit: 2019-07-09 | Discharge: 2019-07-09 | Disposition: A | Discharge status: Home / Self Care | Payer: Medicare Other | Source: Ambulatory Visit | Attending: Orthopedic Surgery | Admitting: Orthopedic Surgery

## 2019-07-09 ENCOUNTER — Encounter (HOSPITAL_BASED_OUTPATIENT_CLINIC_OR_DEPARTMENT_OTHER): Payer: Self-pay | Admitting: Orthopedic Surgery

## 2019-07-09 ENCOUNTER — Other Ambulatory Visit: Payer: Self-pay

## 2019-07-09 DIAGNOSIS — Z20822 Contact with and (suspected) exposure to covid-19: Secondary | ICD-10-CM | POA: Diagnosis not present

## 2019-07-09 DIAGNOSIS — Z01812 Encounter for preprocedural laboratory examination: Secondary | ICD-10-CM | POA: Insufficient documentation

## 2019-07-11 LAB — NOVEL CORONAVIRUS, NAA (HOSP ORDER, SEND-OUT TO REF LAB; TAT 18-24 HRS): SARS-CoV-2, NAA: NOT DETECTED

## 2019-07-12 ENCOUNTER — Ambulatory Visit (HOSPITAL_BASED_OUTPATIENT_CLINIC_OR_DEPARTMENT_OTHER): Payer: Medicare Other | Admitting: Anesthesiology

## 2019-07-12 ENCOUNTER — Other Ambulatory Visit: Payer: Self-pay

## 2019-07-12 ENCOUNTER — Encounter (HOSPITAL_BASED_OUTPATIENT_CLINIC_OR_DEPARTMENT_OTHER)
Admission: RE | Disposition: A | Discharge status: Home / Self Care | Payer: Self-pay | Source: Home / Self Care | Attending: Orthopedic Surgery

## 2019-07-12 ENCOUNTER — Ambulatory Visit (HOSPITAL_BASED_OUTPATIENT_CLINIC_OR_DEPARTMENT_OTHER)
Admission: RE | Admit: 2019-07-12 | Discharge: 2019-07-12 | Disposition: A | Discharge status: Home / Self Care | Payer: Medicare Other | Attending: Orthopedic Surgery | Admitting: Orthopedic Surgery

## 2019-07-12 ENCOUNTER — Encounter (HOSPITAL_BASED_OUTPATIENT_CLINIC_OR_DEPARTMENT_OTHER): Payer: Self-pay | Admitting: Orthopedic Surgery

## 2019-07-12 DIAGNOSIS — I4891 Unspecified atrial fibrillation: Secondary | ICD-10-CM | POA: Insufficient documentation

## 2019-07-12 DIAGNOSIS — Z6830 Body mass index (BMI) 30.0-30.9, adult: Secondary | ICD-10-CM | POA: Insufficient documentation

## 2019-07-12 DIAGNOSIS — I4819 Other persistent atrial fibrillation: Secondary | ICD-10-CM | POA: Diagnosis not present

## 2019-07-12 DIAGNOSIS — E669 Obesity, unspecified: Secondary | ICD-10-CM | POA: Diagnosis not present

## 2019-07-12 DIAGNOSIS — M1990 Unspecified osteoarthritis, unspecified site: Secondary | ICD-10-CM | POA: Insufficient documentation

## 2019-07-12 DIAGNOSIS — M65842 Other synovitis and tenosynovitis, left hand: Secondary | ICD-10-CM | POA: Diagnosis not present

## 2019-07-12 DIAGNOSIS — M65332 Trigger finger, left middle finger: Secondary | ICD-10-CM | POA: Insufficient documentation

## 2019-07-12 DIAGNOSIS — M65322 Trigger finger, left index finger: Secondary | ICD-10-CM | POA: Diagnosis not present

## 2019-07-12 DIAGNOSIS — K219 Gastro-esophageal reflux disease without esophagitis: Secondary | ICD-10-CM | POA: Diagnosis not present

## 2019-07-12 HISTORY — PX: TRIGGER FINGER RELEASE: SHX641

## 2019-07-12 HISTORY — DX: Cardiac arrhythmia, unspecified: I49.9

## 2019-07-12 HISTORY — DX: Gastro-esophageal reflux disease without esophagitis: K21.9

## 2019-07-12 SURGERY — RELEASE, A1 PULLEY, FOR TRIGGER FINGER
Anesthesia: Monitor Anesthesia Care | Site: Hand | Laterality: Left

## 2019-07-12 MED ORDER — LACTATED RINGERS IV SOLN: INTRAVENOUS | Status: DC

## 2019-07-12 MED ORDER — BUPIVACAINE HCL (PF) 0.25 % IJ SOLN
INTRAMUSCULAR | Status: DC | PRN
  Administered 2019-07-12: 6 mL

## 2019-07-12 MED ORDER — CHLORHEXIDINE GLUCONATE 4 % EX LIQD: 60.0000 mL | Freq: Once | CUTANEOUS | Status: DC

## 2019-07-12 MED ORDER — CEFAZOLIN SODIUM-DEXTROSE 2-4 GM/100ML-% IV SOLN: 2.0000 g | INTRAVENOUS | Status: DC

## 2019-07-12 MED ORDER — CEFAZOLIN SODIUM-DEXTROSE 2-4 GM/100ML-% IV SOLN
INTRAVENOUS | Status: AC
  Filled 2019-07-12: qty 100

## 2019-07-12 MED ORDER — LIDOCAINE 2% (20 MG/ML) 5 ML SYRINGE
INTRAMUSCULAR | Status: AC
  Filled 2019-07-12: qty 5

## 2019-07-12 MED ORDER — OXYCODONE HCL 5 MG PO TABS: 5.0000 mg | ORAL_TABLET | Freq: Once | ORAL | Status: DC | PRN

## 2019-07-12 MED ORDER — MIDAZOLAM HCL 2 MG/2ML IJ SOLN
INTRAMUSCULAR | Status: AC
  Filled 2019-07-12: qty 2

## 2019-07-12 MED ORDER — LIDOCAINE HCL (PF) 0.5 % IJ SOLN
INTRAMUSCULAR | Status: DC | PRN
  Administered 2019-07-12: 30 mL via INTRAVENOUS

## 2019-07-12 MED ORDER — FENTANYL CITRATE (PF) 100 MCG/2ML IJ SOLN: 25.0000 ug | INTRAMUSCULAR | Status: DC | PRN

## 2019-07-12 MED ORDER — FENTANYL CITRATE (PF) 100 MCG/2ML IJ SOLN
INTRAMUSCULAR | Status: AC
  Filled 2019-07-12: qty 2

## 2019-07-12 MED ORDER — TRAMADOL HCL 50 MG PO TABS: 50.0000 mg | ORAL_TABLET | Freq: Four times a day (QID) | ORAL | 0 refills | Status: DC | PRN

## 2019-07-12 MED ORDER — OXYCODONE HCL 5 MG/5ML PO SOLN: 5.0000 mg | Freq: Once | ORAL | Status: DC | PRN

## 2019-07-12 MED ORDER — ONDANSETRON HCL 4 MG/2ML IJ SOLN
INTRAMUSCULAR | Status: AC
  Filled 2019-07-12: qty 2

## 2019-07-12 MED ORDER — SUCCINYLCHOLINE CHLORIDE 200 MG/10ML IV SOSY
PREFILLED_SYRINGE | INTRAVENOUS | Status: AC
  Filled 2019-07-12: qty 10

## 2019-07-12 MED ORDER — ONDANSETRON HCL 4 MG/2ML IJ SOLN: 4.0000 mg | Freq: Once | INTRAMUSCULAR | Status: DC | PRN

## 2019-07-12 MED ORDER — MIDAZOLAM HCL 2 MG/2ML IJ SOLN
1.0000 mg | INTRAMUSCULAR | Status: DC | PRN
  Administered 2019-07-12: 1 mg via INTRAVENOUS

## 2019-07-12 MED ORDER — PROPOFOL 10 MG/ML IV BOLUS
INTRAVENOUS | Status: DC | PRN
  Administered 2019-07-12: 40 mg via INTRAVENOUS

## 2019-07-12 MED ORDER — FENTANYL CITRATE (PF) 100 MCG/2ML IJ SOLN
50.0000 ug | INTRAMUSCULAR | Status: DC | PRN
  Administered 2019-07-12 (×2): 50 ug via INTRAVENOUS

## 2019-07-12 MED ORDER — ONDANSETRON HCL 4 MG/2ML IJ SOLN
INTRAMUSCULAR | Status: DC | PRN
  Administered 2019-07-12: 4 mg via INTRAVENOUS

## 2019-07-12 SURGICAL SUPPLY — 33 items
BLADE SURG 15 STRL LF DISP TIS (BLADE) ×1 IMPLANT
BLADE SURG 15 STRL SS (BLADE) ×1
BNDG COHESIVE 2X5 TAN STRL LF (GAUZE/BANDAGES/DRESSINGS) ×2 IMPLANT
BNDG ESMARK 4X9 LF (GAUZE/BANDAGES/DRESSINGS) ×2 IMPLANT
CHLORAPREP W/TINT 26 (MISCELLANEOUS) ×2 IMPLANT
CORD BIPOLAR FORCEPS 12FT (ELECTRODE) IMPLANT
COVER BACK TABLE 60X90IN (DRAPES) ×2 IMPLANT
COVER MAYO STAND STRL (DRAPES) ×2 IMPLANT
COVER WAND RF STERILE (DRAPES) IMPLANT
CUFF TOURN SGL QUICK 18X4 (TOURNIQUET CUFF) ×2 IMPLANT
DECANTER SPIKE VIAL GLASS SM (MISCELLANEOUS) IMPLANT
DRAPE EXTREMITY T 121X128X90 (DISPOSABLE) ×2 IMPLANT
DRAPE SURG 17X23 STRL (DRAPES) ×2 IMPLANT
GAUZE SPONGE 4X4 12PLY STRL (GAUZE/BANDAGES/DRESSINGS) ×2 IMPLANT
GAUZE XEROFORM 1X8 LF (GAUZE/BANDAGES/DRESSINGS) ×2 IMPLANT
GLOVE BIOGEL PI IND STRL 7.0 (GLOVE) ×2 IMPLANT
GLOVE BIOGEL PI IND STRL 8.5 (GLOVE) ×1 IMPLANT
GLOVE BIOGEL PI INDICATOR 7.0 (GLOVE) ×2
GLOVE BIOGEL PI INDICATOR 8.5 (GLOVE) ×1
GLOVE ECLIPSE 6.5 STRL STRAW (GLOVE) ×2 IMPLANT
GLOVE SURG ORTHO 8.0 STRL STRW (GLOVE) ×4 IMPLANT
GOWN STRL REUS W/ TWL LRG LVL3 (GOWN DISPOSABLE) ×1 IMPLANT
GOWN STRL REUS W/TWL LRG LVL3 (GOWN DISPOSABLE) ×1
GOWN STRL REUS W/TWL XL LVL3 (GOWN DISPOSABLE) ×2 IMPLANT
NEEDLE PRECISIONGLIDE 27X1.5 (NEEDLE) ×2 IMPLANT
NS IRRIG 1000ML POUR BTL (IV SOLUTION) ×2 IMPLANT
PACK BASIN DAY SURGERY FS (CUSTOM PROCEDURE TRAY) ×2 IMPLANT
STOCKINETTE 4X48 STRL (DRAPES) ×2 IMPLANT
SUT ETHILON 4 0 PS 2 18 (SUTURE) ×2 IMPLANT
SYR BULB 3OZ (MISCELLANEOUS) ×2 IMPLANT
SYR CONTROL 10ML LL (SYRINGE) ×2 IMPLANT
TOWEL GREEN STERILE FF (TOWEL DISPOSABLE) ×4 IMPLANT
UNDERPAD 30X36 HEAVY ABSORB (UNDERPADS AND DIAPERS) ×2 IMPLANT

## 2019-07-12 NOTE — Anesthesia Postprocedure Evaluation (Signed)
Anesthesia Post Note  Patient: DAMAREON LANNI  Procedure(s) Performed: RELEASE TRIGGER FINGER/A-1 PULLEY LEFT MIDDLE FINGER (Left Hand)     Patient location during evaluation: PACU Anesthesia Type: MAC and Bier Block Level of consciousness: awake and alert and oriented Pain management: pain level controlled Vital Signs Assessment: post-procedure vital signs reviewed and stable Respiratory status: spontaneous breathing, nonlabored ventilation and respiratory function stable Cardiovascular status: stable and blood pressure returned to baseline Postop Assessment: no apparent nausea or vomiting Anesthetic complications: no    Last Vitals:  Vitals:   07/12/19 1149 07/12/19 1345  BP: (!) 134/92 136/89  Pulse: 76 65  Resp: 18 (!) 9  Temp: (!) 36.4 C 36.8 C  SpO2: 98% 99%    Last Pain:  Vitals:   07/12/19 1345  TempSrc:   PainSc: 0-No pain                 Michelangelo Rindfleisch A.

## 2019-07-12 NOTE — H&P (Signed)
Wyatt Duran is an 74 y.o. male.   Chief Complaint: catching left middle finger HPI: Wyatt Duran is a 74 year old right-hand-dominant male who comes in with a complaint of pain in his left middle finger with catching. This been going on for the past 2 to 3 weeks. He states it had a weird feeling initially. And then began catching. It is worse in the morning. He has no history of injury. He has pain over the metacarpal phalangeal joint volar aspect. He has no history of injury has not tried anything for this. He has no history of diabetes thyroid problems arthritis or gout. Family history is positive diabetes negative for the remainder. He has been tested. He is not complaining of any numbness or tingling. Had an injection into the A1 pulley at that time. He continues complain of catching of his finger. At this been injected 2 times. He complains of pain at the metacarpal phalangeal joint is very reluctant to make composite fist.     Past Medical History:  Diagnosis Date  . Atrial fibrillation ([place])   . Atrial flutter ([place]) 3 /13  . DJD (degenerative joint disease)   . Dysrhythmia    a-fib  . GERD (gastroesophageal reflux disease)   . Lumbar disc disease   . Syncope 2012   negative workup    Past Surgical History:  Procedure Laterality Date  . APPENDECTOMY    . ATRIAL FIBRILLATION ABLATION N/A 12/06/2011   Procedure: ATRIAL FIBRILLATION ABLATION;  Surgeon: Thompson Grayer, MD;  Location: [facility] CATH LAB;  Service: Cardiovascular;  Laterality: N/A;  . Atrial fibrillation and Atrial flutter ablation  12/06/11   PVI and CTI ablation by Dr Rayann Heman  . BACK SURGERY    . CARDIOVERSION  11/30/2011   Procedure: CARDIOVERSION;  Surgeon: Jacolyn Reedy, MD;  Location: [place];  Service: Cardiovascular;  Laterality: N/A;  . ENDOVENOUS ABLATION SAPHENOUS VEIN W/ LASER Left 08/08/2017   endovenous laser ablation L greater saphenous vein by Tinnie Gens MD   . [place]    . LAPAROSCOPIC CHOLECYSTECTOMY    .  LUMBAR LAMINECTOMY    . stab phlebectomy Left 11/28/2017   stab phlebectomy 10-20 incisions left leg by Tinnie Gens MD   . TEE WITHOUT CARDIOVERSION  12/06/2011   Procedure: TRANSESOPHAGEAL ECHOCARDIOGRAM (TEE);  Surgeon: Jolaine Artist, MD;  Location: [facility] ENDOSCOPY;  Service: Cardiovascular;  Laterality: N/A;    Family History  Problem Relation Age of Onset  . Alcoholism Father   . Heart attack Father   . Heart attack Mother   . Atrial fibrillation Mother   . Atrial fibrillation Brother    Social History:  reports that he has never smoked. He has never used smokeless tobacco. He reports current alcohol use. He reports that he does not use drugs.  Allergies: No Known Allergies  No medications prior to admission.    No results found for this or any previous visit (from the past 48 hour(s)).  No results found.   Pertinent items are noted in HPI.  There were no vitals taken for this visit.  General appearance: alert, cooperative and appears stated age Head: Normocephalic, without obvious abnormality Neck: no JVD Resp: clear to auscultation bilaterally Cardio: regular rate and rhythm, S1, S2 normal, no murmur, click, rub or gallop GI: soft, non-tender; bowel sounds normal; no masses,  no organomegaly Extremities:  catching left middle finger Pulses: 2+ and symmetric Skin: Skin color, texture, turgor normal. No rashes or lesions Neurologic: Grossly normal  Incision/Wound: na  Assessment/Plan Assessment:  1. Trigger middle finger of left hand    Plan: We have discussed surgical release of the A1 pulley left middle finger. Preperi-and postoperative course are discussed along with risks and complications. He is aware there is no guarantee to the surgery the possibility of infection recurrence injury to arteries nerves tendons complete relief symptoms and dystrophy. He is scheduled for A1 pulley release left middle finger as an outpatient under regional anesthesia.       Daryll Brod 07/12/2019, 8:50 AM

## 2019-07-12 NOTE — Discharge Instructions (Signed)

## 2019-07-12 NOTE — Anesthesia Preprocedure Evaluation (Addendum)
Anesthesia Evaluation  Patient identified by MRN, date of birth, ID band Patient awake    Reviewed: Allergy & Precautions, NPO status , Patient's Chart, lab work & pertinent test results  Airway Mallampati: II  TM Distance: >3 FB Neck ROM: Full    Dental no notable dental hx. (+) Dental Advisory Given, Teeth Intact   Pulmonary neg pulmonary ROS,    Pulmonary exam normal breath sounds clear to auscultation       Cardiovascular Normal cardiovascular exam+ dysrhythmias Atrial Fibrillation  Rhythm:Regular Rate:Normal  S/P ablation    Neuro/Psych negative neurological ROS  negative psych ROS   GI/Hepatic Neg liver ROS, GERD  Medicated and Controlled,  Endo/Other  Obesity   Renal/GU negative Renal ROS  negative genitourinary   Musculoskeletal  (+) Arthritis , Osteoarthritis,  Left middle trigger finger   Abdominal   Peds  Hematology negative hematology ROS (+)   Anesthesia Other Findings   Reproductive/Obstetrics                            Anesthesia Physical Anesthesia Plan  ASA: II  Anesthesia Plan: Bier Block and MAC and Bier Block-LIDOCAINE ONLY   Post-op Pain Management:    Induction: Intravenous  PONV Risk Score and Plan: 1 and Ondansetron, Propofol infusion and Treatment may vary due to age or medical condition  Airway Management Planned: Natural Airway, Nasal Cannula and Simple Face Mask  Additional Equipment:   Intra-op Plan:   Post-operative Plan:   Informed Consent: I have reviewed the patients History and Physical, chart, labs and discussed the procedure including the risks, benefits and alternatives for the proposed anesthesia with the patient or authorized representative who has indicated his/her understanding and acceptance.     Dental advisory given  Plan Discussed with: CRNA and Surgeon  Anesthesia Plan Comments:         Anesthesia Quick Evaluation

## 2019-07-12 NOTE — Brief Op Note (Signed)
07/12/2019  1:39 PM  PATIENT:  Wyatt Duran  74 y.o. male  PRE-OPERATIVE DIAGNOSIS:  TRIGGER LEFT MIDDLE FINGER  POST-OPERATIVE DIAGNOSIS:  TRIGGER LEFT MIDDLE FINGER  PROCEDURE:  Procedure(s) with comments: RELEASE TRIGGER FINGER/A-1 PULLEY LEFT MIDDLE FINGER (Left) - IV REGIONAL FOREARM BLOCK  SURGEON:  Surgeon(s) and Role:    * Daryll Brod, MD - Primary  PHYSICIAN ASSISTANT:   ASSISTANTS: none   ANESTHESIA:   local, regional and IV sedation  EBL:  57ml   BLOOD ADMINISTERED:none  DRAINS: none   LOCAL MEDICATIONS USED:  BUPIVICAINE   SPECIMEN:  No Specimen  DISPOSITION OF SPECIMEN:  N/A  COUNTS:  YES  TOURNIQUET:   Total Tourniquet Time Documented: Forearm (Left) - 18 minutes Total: Forearm (Left) - 18 minutes   DICTATION: .Dragon Dictation  PLAN OF CARE: Discharge to home after PACU  PATIENT DISPOSITION:  PACU - hemodynamically stable.

## 2019-07-12 NOTE — Transfer of Care (Signed)
Immediate Anesthesia Transfer of Care Note  Patient: Wyatt Duran  Procedure(s) Performed: RELEASE TRIGGER FINGER/A-1 PULLEY LEFT MIDDLE FINGER (Left Hand)  Patient Location: PACU  Anesthesia Type:MAC and Bier block  Level of Consciousness: awake, alert  and oriented  Airway & Oxygen Therapy: Patient Spontanous Breathing and Patient connected to face mask oxygen  Post-op Assessment: Report given to RN and Post -op Vital signs reviewed and stable  Post vital signs: Reviewed and stable  Last Vitals:  Vitals Value Taken Time  BP 136/89 07/12/19 1345  Temp    Pulse 65 07/12/19 1345  Resp 9 07/12/19 1345  SpO2 99 % 07/12/19 1345  Vitals shown include unvalidated device data.  Last Pain:  Vitals:   07/12/19 1149  TempSrc: Temporal  PainSc: 3          Complications: No apparent anesthesia complications

## 2019-07-12 NOTE — Op Note (Signed)
NAME: Wyatt Duran RECORD NO: [id number] DATE OF BIRTH: 1945/09/13 FAC[facility] LOCATION: [facility] PHYSICIAN: Wynonia Sours, MD   OPERATIVE REPORT   DATE OF PROCEDURE: 07/12/19    PREOPERATIVE DIAGNOSIS:   Stenosing tenosynovitis left middle finger   POSTOPERATIVE DIAGNOSIS:   Same   PROCEDURE:   Release A1 pulley left middle finger   SURGEON: Daryll Brod, M.D.   ASSISTANT: none   ANESTHESIA:  Bier block with sedation and Local   INTRAVENOUS FLUIDS:  Per anesthesia flow sheet.   ESTIMATED BLOOD LOSS:  Minimal.   COMPLICATIONS:  None.   SPECIMENS:  none   TOURNIQUET TIME:    Total Tourniquet Time Documented: Forearm (Left) - 18 minutes Total: Forearm (Left) - 18 minutes    DISPOSITION:  Stable to PACU.   INDICATIONS: Patient is a 74 year old male with a history of triggering of his left middle finger.  This not responded to conservative treatment.  He is elected undergo surgical release of the A1 pulley.  Pre-peripostoperative course been discussed along with risk complications.  He is aware that there is no guarantee to the surgery the possibility of infection recurrence injury to arteries nerves tendons complete relief of symptoms and dystrophy.  In the preoperative area the patient seen extremity marked by both patient and surgeon antibiotic given  OPERATIVE COURSE: Patient is brought to the operating room and placed in a supine position with the left arm free.  A forearm-based IV regional anesthetic was carried out without difficulty under the direction the anesthesia department.  He was prepped using ChloraPrep.  A 3-minute dry time was allowed timeout taken to confirm patient procedure.  An oblique incision was made over the A1 pulley of the left middle finger carried down through subcutaneous tissue.  Bleeders were electrocauterized with bipolar.  The neurovascular bundles radially and ulnarly were identified retracted.  The A1  pulley was found to be markedly thickened.  This was released on its radial aspect.  A small incision made was made centrally and A2.  Tenosynovial tissue proximally was separated with blunt dissection.  The finger was placed full range of motion no further triggering was noted.  2 flexor tendons were then separated using retractors breaking any adhesions in the tenosynovial tissue between the 2 tendons.  Wound was copious irrigated with saline.  Skin was closed interrupted 4-0 nylon sutures.  Local infiltration with quarter percent bupivacaine without epinephrine was given approximately 7 cc was used.  Sterile compressive dressing with the fingers free was applied.  Deflation of the tourniquet all fingers immediately pink.  He was taken to the recovery room for observation in satisfactory condition.  He will be discharged home to return to hand center [facility] in 1 week Tylenol ibuprofen for pain with Ultram for breakthrough.   Daryll Brod, MD Electronically signed, 07/12/19

## 2019-07-15 ENCOUNTER — Encounter: Payer: Self-pay | Admitting: *Deleted

## 2019-10-01 DIAGNOSIS — X32XXXD Exposure to sunlight, subsequent encounter: Secondary | ICD-10-CM | POA: Diagnosis not present

## 2019-10-01 DIAGNOSIS — D225 Melanocytic nevi of trunk: Secondary | ICD-10-CM | POA: Diagnosis not present

## 2019-10-01 DIAGNOSIS — D485 Neoplasm of uncertain behavior of skin: Secondary | ICD-10-CM | POA: Diagnosis not present

## 2019-10-01 DIAGNOSIS — D2261 Melanocytic nevi of right upper limb, including shoulder: Secondary | ICD-10-CM | POA: Diagnosis not present

## 2019-10-01 DIAGNOSIS — Z1283 Encounter for screening for malignant neoplasm of skin: Secondary | ICD-10-CM | POA: Diagnosis not present

## 2019-10-01 DIAGNOSIS — L570 Actinic keratosis: Secondary | ICD-10-CM | POA: Diagnosis not present

## 2019-10-01 DIAGNOSIS — C4362 Malignant melanoma of left upper limb, including shoulder: Secondary | ICD-10-CM | POA: Diagnosis not present

## 2019-10-01 DIAGNOSIS — Z08 Encounter for follow-up examination after completed treatment for malignant neoplasm: Secondary | ICD-10-CM | POA: Diagnosis not present

## 2019-10-01 DIAGNOSIS — Z85820 Personal history of malignant melanoma of skin: Secondary | ICD-10-CM | POA: Diagnosis not present

## 2019-10-10 DIAGNOSIS — C4362 Malignant melanoma of left upper limb, including shoulder: Secondary | ICD-10-CM | POA: Diagnosis not present

## 2019-10-14 DIAGNOSIS — D485 Neoplasm of uncertain behavior of skin: Secondary | ICD-10-CM | POA: Diagnosis not present

## 2019-10-18 DIAGNOSIS — Z01812 Encounter for preprocedural laboratory examination: Secondary | ICD-10-CM | POA: Diagnosis not present

## 2019-10-18 DIAGNOSIS — Z20822 Contact with and (suspected) exposure to covid-19: Secondary | ICD-10-CM | POA: Diagnosis not present

## 2019-10-21 DIAGNOSIS — Z79899 Other long term (current) drug therapy: Secondary | ICD-10-CM | POA: Diagnosis not present

## 2019-10-21 DIAGNOSIS — C4362 Malignant melanoma of left upper limb, including shoulder: Secondary | ICD-10-CM | POA: Diagnosis not present

## 2019-10-21 DIAGNOSIS — Z823 Family history of stroke: Secondary | ICD-10-CM | POA: Diagnosis not present

## 2019-10-21 DIAGNOSIS — K219 Gastro-esophageal reflux disease without esophagitis: Secondary | ICD-10-CM | POA: Diagnosis not present

## 2019-10-21 DIAGNOSIS — Z7982 Long term (current) use of aspirin: Secondary | ICD-10-CM | POA: Diagnosis not present

## 2019-10-21 DIAGNOSIS — Z9049 Acquired absence of other specified parts of digestive tract: Secondary | ICD-10-CM | POA: Diagnosis not present

## 2019-10-23 ENCOUNTER — Encounter (INDEPENDENT_AMBULATORY_CARE_PROVIDER_SITE_OTHER): Payer: Medicare Other | Admitting: Ophthalmology

## 2019-10-28 DIAGNOSIS — Z961 Presence of intraocular lens: Secondary | ICD-10-CM | POA: Diagnosis not present

## 2019-10-29 DIAGNOSIS — C4360 Malignant melanoma of unspecified upper limb, including shoulder: Secondary | ICD-10-CM | POA: Diagnosis not present

## 2019-10-29 DIAGNOSIS — Z09 Encounter for follow-up examination after completed treatment for conditions other than malignant neoplasm: Secondary | ICD-10-CM | POA: Diagnosis not present

## 2019-11-05 DIAGNOSIS — Z09 Encounter for follow-up examination after completed treatment for conditions other than malignant neoplasm: Secondary | ICD-10-CM | POA: Diagnosis not present

## 2019-11-05 DIAGNOSIS — C4362 Malignant melanoma of left upper limb, including shoulder: Secondary | ICD-10-CM | POA: Diagnosis not present

## 2019-12-06 ENCOUNTER — Other Ambulatory Visit: Payer: Self-pay

## 2019-12-06 ENCOUNTER — Encounter (INDEPENDENT_AMBULATORY_CARE_PROVIDER_SITE_OTHER): Payer: Medicare Other | Admitting: Ophthalmology

## 2019-12-06 DIAGNOSIS — H43813 Vitreous degeneration, bilateral: Secondary | ICD-10-CM | POA: Diagnosis not present

## 2019-12-06 DIAGNOSIS — H35341 Macular cyst, hole, or pseudohole, right eye: Secondary | ICD-10-CM | POA: Diagnosis not present

## 2020-01-08 DIAGNOSIS — L821 Other seborrheic keratosis: Secondary | ICD-10-CM | POA: Diagnosis not present

## 2020-01-08 DIAGNOSIS — D485 Neoplasm of uncertain behavior of skin: Secondary | ICD-10-CM | POA: Diagnosis not present

## 2020-01-08 DIAGNOSIS — Z1283 Encounter for screening for malignant neoplasm of skin: Secondary | ICD-10-CM | POA: Diagnosis not present

## 2020-01-08 DIAGNOSIS — Z08 Encounter for follow-up examination after completed treatment for malignant neoplasm: Secondary | ICD-10-CM | POA: Diagnosis not present

## 2020-01-08 DIAGNOSIS — Z85820 Personal history of malignant melanoma of skin: Secondary | ICD-10-CM | POA: Diagnosis not present

## 2020-01-20 NOTE — Progress Notes (Deleted)
Cardiology Office Note:    Date:  01/20/2020   ID:  Wyatt Duran, DOB April 20, 1946, MRN [id number]  PCP:  Maurice Small, MD  Cardiologist:  Shirlee More, MD    Referring MD: Maurice Small, MD    ASSESSMENT:    No diagnosis found. PLAN:    In order of problems listed above:  1. ***   Next appointment: ***   Medication Adjustments/Labs and Tests Ordered: Current medicines are reviewed at length with the patient today.  Concerns regarding medicines are outlined above.  No orders of the defined types were placed in this encounter.  No orders of the defined types were placed in this encounter.   No chief complaint on file.   History of Present Illness:    Wyatt Duran is a 74 y.o. male with a hx of paroxysmal atrial fibrillation with EP catheter ablation 2013 last seen 07/04/2019. Compliance with diet, lifestyle and medications: *** Past Medical History:  Diagnosis Date  . Atrial fibrillation ([place])   . Atrial flutter ([place]) 3 /13  . DJD (degenerative joint disease)   . Dysrhythmia    a-fib  . GERD (gastroesophageal reflux disease)   . Lumbar disc disease   . Syncope 2012   negative workup    Past Surgical History:  Procedure Laterality Date  . APPENDECTOMY    . ATRIAL FIBRILLATION ABLATION N/A 12/06/2011   Procedure: ATRIAL FIBRILLATION ABLATION;  Surgeon: Thompson Grayer, MD;  Location[facility] CATH LAB;  Service: Cardiovascular;  Laterality: N/A;  . Atrial fibrillation and Atrial flutter ablation  12/06/11   PVI and CTI ablation by Dr Rayann Heman  . BACK SURGERY    . CARDIOVERSION  11/30/2011   Procedure: CARDIOVERSION;  Surgeon: Jacolyn Reedy, MD;  Location: [place];  Service: Cardiovascular;  Laterality: N/A;  . ENDOVENOUS ABLATION SAPHENOUS VEIN W/ LASER Left 08/08/2017   endovenous laser ablation L greater saphenous vein by Tinnie Gens MD   . [place]    . LAPAROSCOPIC CHOLECYSTECTOMY    . LUMBAR LAMINECTOMY    . stab phlebectomy Left 11/28/2017   stab  phlebectomy 10-20 incisions left leg by Tinnie Gens MD   . TEE WITHOUT CARDIOVERSION  12/06/2011   Procedure: TRANSESOPHAGEAL ECHOCARDIOGRAM (TEE);  Surgeon: Jolaine Artist, MD;  Location: [facility] ENDOSCOPY;  Service: Cardiovascular;  Laterality: N/A;  . TRIGGER FINGER RELEASE Left 07/12/2019   Procedure: RELEASE TRIGGER FINGER/A-1 PULLEY LEFT MIDDLE FINGER;  Surgeon: Daryll Brod, MD;  Location: [place];  Service: Orthopedics;  Laterality: Left;  IV REGIONAL FOREARM BLOCK    Current Medications: No outpatient medications have been marked as taking for the 01/22/20 encounter (Appointment) with Richardo Priest, MD.     Allergies:   Patient has no known allergies.   Social History   Socioeconomic History  . Marital status: Married    Spouse name: Not on file  . Number of children: Not on file  . Years of education: Not on file  . Highest education level: Not on file  Occupational History  . Not on file  Tobacco Use  . Smoking status: Never Smoker  . Smokeless tobacco: Never Used  Vaping Use  . Vaping Use: Never used  Substance and Sexual Activity  . Alcohol use: Yes    Comment: couple of glasses of wine per week  . Drug use: No  . Sexual activity: Not on file  Other Topics Concern  . Not on file  Social History Narrative  Pt lives in [place].  Consultant in Advertising copywriter.    Social Determinants of Health   Financial Resource Strain:   . Difficulty of Paying Living Expenses:   Food Insecurity:   . Worried About Charity fundraiser in the Last Year:   . Arboriculturist in the Last Year:   Transportation Needs:   . Film/video editor (Medical):   Marland Kitchen Lack of Transportation (Non-Medical):   Physical Activity:   . Days of Exercise per Week:   . Minutes of Exercise per Session:   Stress:   . Feeling of Stress :   Social Connections:   . Frequency of Communication with Friends and Family:   . Frequency of Social Gatherings with Friends and  Family:   . Attends Religious Services:   . Active Member of Clubs or Organizations:   . Attends Archivist Meetings:   Marland Kitchen Marital Status:      Family History: The patient's ***family history includes Alcoholism in his father; Atrial fibrillation in his brother and mother; Heart attack in his father and mother. ROS:   Please see the history of present illness.    All other systems reviewed and are negative.  EKGs/Labs/Other Studies Reviewed:    The following studies were reviewed today:  EKG:  EKG ordered today and personally reviewed.  The ekg ordered today demonstrates ***  Recent Labs: No results found for requested labs within last 8760 hours.  Recent Lipid Panel No results found for: CHOL, TRIG, HDL, CHOLHDL, VLDL, LDLCALC, LDLDIRECT  Physical Exam:    VS:  There were no vitals taken for this visit.    Wt Readings from Last 3 Encounters:  07/12/19 226 lb 6.6 oz (102.7 kg)  07/04/19 230 lb 3.2 oz (104.4 kg)  01/22/19 233 lb (105.7 kg)     GEN: *** Well nourished, well developed in no acute distress HEENT: Normal NECK: No JVD; No carotid bruits LYMPHATICS: No lymphadenopathy CARDIAC: ***RRR, no murmurs, rubs, gallops RESPIRATORY:  Clear to auscultation without rales, wheezing or rhonchi  ABDOMEN: Soft, non-tender, non-distended MUSCULOSKELETAL:  No edema; No deformity  SKIN: Warm and dry NEUROLOGIC:  Alert and oriented x 3 PSYCHIATRIC:  Normal affect    Signed, Shirlee More, MD  01/20/2020 12:13 PM    [facility]

## 2020-01-22 ENCOUNTER — Ambulatory Visit (INDEPENDENT_AMBULATORY_CARE_PROVIDER_SITE_OTHER): Payer: Medicare Other | Admitting: Cardiology

## 2020-01-22 ENCOUNTER — Ambulatory Visit: Payer: Medicare Other | Admitting: Cardiology

## 2020-01-22 ENCOUNTER — Encounter: Payer: Self-pay | Admitting: Cardiology

## 2020-01-22 ENCOUNTER — Other Ambulatory Visit: Payer: Self-pay

## 2020-01-22 VITALS — BP 128/80 | HR 70 | Ht 72.0 in | Wt 235.0 lb

## 2020-01-22 DIAGNOSIS — I480 Paroxysmal atrial fibrillation: Secondary | ICD-10-CM

## 2020-01-22 NOTE — Progress Notes (Signed)
Cardiology Office Note:    Date:  01/22/2020   ID:  NITISH ROES, DOB May 18, 1946, MRN [id number]  PCP:  Maurice Small, MD  Cardiologist:  Shirlee More, MD    Referring MD: Maurice Small, MD    ASSESSMENT:    1. PAF (paroxysmal atrial fibrillation) (HCC)    PLAN:    In order of problems listed above:  1. He truly has done well no clinical recurrence I asked him to continue to screen his rhythm at home plan to see him in 1 year in the office and on think he needs any cardiac event monitors or imaging at this time.  He will continue low-dose aspirin for stroke prophylaxis and is not anticoagulated.   Next appointment: 1 year   Medication Adjustments/Labs and Tests Ordered: Current medicines are reviewed at length with the patient today.  Concerns regarding medicines are outlined above.  No orders of the defined types were placed in this encounter.  No orders of the defined types were placed in this encounter.   Chief Complaint  Patient presents with  . Atrial Fibrillation    History of Present Illness:     LESLIE LANGILLE is a 74 y.o. male with a hx of paroxysmal atrial fibrillation with EP catheter ablation 2013    last seen 07/04/2019.  He had resection of melanoma T1b left forearm treated with wide local extension sentinel node biopsy and closure. Compliance with diet, lifestyle and medications: Yes  He follows his heart rhythm at home with the parents had no irregularity or rapid rate.  I cannot access in Care Everywhere but he assures me it EKG prior to surgery [facility] April 2021 and was in sinus rhythm.  Since then he has had no palpitation has recovered and has no evidence of metastatic disease.  His recent lipid profile was favorable cholesterol 147 HDL 48 LDL 85. Past Medical History:  Diagnosis Date  . Atrial fibrillation ([place])   . Atrial flutter ([place]) 3 /13  . DJD (degenerative joint disease)   . Dysrhythmia    a-fib  . GERD (gastroesophageal  reflux disease)   . Lumbar disc disease   . Syncope 2012   negative workup    Past Surgical History:  Procedure Laterality Date  . APPENDECTOMY    . ATRIAL FIBRILLATION ABLATION N/A 12/06/2011   Procedure: ATRIAL FIBRILLATION ABLATION;  Surgeon: Thompson Grayer, MD;  [facility] C[facility] LAB;  Service: Cardiovascular;  Laterality: N/A;  . Atrial fibrillation and Atrial flutter ablation  12/06/11   PVI and CTI ablation by Dr Rayann Heman  . BACK SURGERY    . CARDIOVERSION  11/30/2011   Procedure: CARDIOVERSION;  Surgeon: Jacolyn Reedy, MD;  Location: [place];  Service: Cardiovascular;  Laterality: N/A;  . ENDOVENOUS ABLATION SAPHENOUS VEIN W/ LASER Left 08/08/2017   endovenous laser ablation L greater saphenous vein by Tinnie Gens MD   . [place]    . LAPAROSCOPIC CHOLECYSTECTOMY    . LUMBAR LAMINECTOMY    . stab phlebectomy Left 11/28/2017   stab phlebectomy 10-20 incisions left leg by Tinnie Gens MD   . TEE WITHOUT CARDIOVERSION  12/06/2011   Procedure: TRANSESOPHAGEAL ECHOCARDIOGRAM (TEE);  Surgeon: Jolaine Artist, MD;  Location: [facility] ENDOSCOPY;  Service: Cardiovascular;  Laterality: N/A;  . TRIGGER FINGER RELEASE Left 07/12/2019   Procedure: RELEASE TRIGGER FINGER/A-1 PULLEY LEFT MIDDLE FINGER;  Surgeon: Daryll Brod, MD;  Location: [place];  Service: Orthopedics;  Laterality: Left;  IV REGIONAL FOREARM BLOCK    Current Medications: No outpatient medications have been marked as taking for the 01/22/20 encounter (Office Visit) with Richardo Priest, MD.     Allergies:   Patient has no known allergies.   Social History   Socioeconomic History  . Marital status: Married    Spouse name: Not on file  . Number of children: Not on file  . Years of education: Not on file  . Highest education level: Not on file  Occupational History  . Not on file  Tobacco Use  . Smoking status: Never Smoker  . Smokeless tobacco: Never Used  Vaping Use  . Vaping Use: Never used    Substance and Sexual Activity  . Alcohol use: Yes    Comment: couple of glasses of wine per week  . Drug use: No  . Sexual activity: Not on file  Other Topics Concern  . Not on file  Social History Narrative   Pt lives in [place].  Consultant in Advertising copywriter.    Social Determinants of Health   Financial Resource Strain:   . Difficulty of Paying Living Expenses:   Food Insecurity:   . Worried About Charity fundraiser in the Last Year:   . Arboriculturist in the Last Year:   Transportation Needs:   . Film/video editor (Medical):   Marland Kitchen Lack of Transportation (Non-Medical):   Physical Activity:   . Days of Exercise per Week:   . Minutes of Exercise per Session:   Stress:   . Feeling of Stress :   Social Connections:   . Frequency of Communication with Friends and Family:   . Frequency of Social Gatherings with Friends and Family:   . Attends Religious Services:   . Active Member of Clubs or Organizations:   . Attends Archivist Meetings:   Marland Kitchen Marital Status:      Family History: The patient's family history includes Alcoholism in his father; Atrial fibrillation in his brother and mother; Heart attack in his father and mother. ROS:   Please see the history of present illness.    All other systems reviewed and are negative.  EKGs/Labs/Other Studies Reviewed:    The following studies were reviewed today:     Physical Exam:    VS:  BP 128/80   Pulse 70   Ht 6' (1.829 m)   Wt 235 lb (106.6 kg)   SpO2 94%   BMI 31.87 kg/m     Wt Readings from Last 3 Encounters:  01/22/20 235 lb (106.6 kg)  07/12/19 226 lb 6.6 oz (102.7 kg)  07/04/19 230 lb 3.2 oz (104.4 kg)     GEN:  Well nourished, well developed in no acute distress HEENT: Normal NECK: No JVD; No carotid bruits LYMPHATICS: No lymphadenopathy CARDIAC: RRR, no murmurs, rubs, gallops RESPIRATORY:  Clear to auscultation without rales, wheezing or rhonchi  ABDOMEN: Soft,  non-tender, non-distended MUSCULOSKELETAL:  No edema; No deformity  SKIN: Warm and dry NEUROLOGIC:  Alert and oriented x 3 PSYCHIATRIC:  Normal affect    Signed, Shirlee More, MD  01/22/2020 1:08 PM    [facility]

## 2020-01-22 NOTE — Patient Instructions (Signed)

## 2020-03-03 DIAGNOSIS — L570 Actinic keratosis: Secondary | ICD-10-CM | POA: Diagnosis not present

## 2020-03-03 DIAGNOSIS — X32XXXD Exposure to sunlight, subsequent encounter: Secondary | ICD-10-CM | POA: Diagnosis not present

## 2020-04-15 DIAGNOSIS — M25561 Pain in right knee: Secondary | ICD-10-CM | POA: Diagnosis not present

## 2020-04-15 DIAGNOSIS — M1711 Unilateral primary osteoarthritis, right knee: Secondary | ICD-10-CM | POA: Diagnosis not present

## 2020-04-17 DIAGNOSIS — E785 Hyperlipidemia, unspecified: Secondary | ICD-10-CM | POA: Diagnosis not present

## 2020-04-17 DIAGNOSIS — Z5181 Encounter for therapeutic drug level monitoring: Secondary | ICD-10-CM | POA: Diagnosis not present

## 2020-04-17 DIAGNOSIS — C439 Malignant melanoma of skin, unspecified: Secondary | ICD-10-CM | POA: Diagnosis not present

## 2020-04-17 DIAGNOSIS — Z0000 Encounter for general adult medical examination without abnormal findings: Secondary | ICD-10-CM | POA: Diagnosis not present

## 2020-04-17 DIAGNOSIS — I480 Paroxysmal atrial fibrillation: Secondary | ICD-10-CM | POA: Diagnosis not present

## 2020-04-17 DIAGNOSIS — R030 Elevated blood-pressure reading, without diagnosis of hypertension: Secondary | ICD-10-CM | POA: Diagnosis not present

## 2020-04-17 DIAGNOSIS — Z23 Encounter for immunization: Secondary | ICD-10-CM | POA: Diagnosis not present

## 2020-04-28 DIAGNOSIS — Z6832 Body mass index (BMI) 32.0-32.9, adult: Secondary | ICD-10-CM | POA: Diagnosis not present

## 2020-04-28 DIAGNOSIS — C4362 Malignant melanoma of left upper limb, including shoulder: Secondary | ICD-10-CM | POA: Diagnosis not present

## 2020-06-04 DIAGNOSIS — M1711 Unilateral primary osteoarthritis, right knee: Secondary | ICD-10-CM | POA: Diagnosis not present

## 2020-06-11 DIAGNOSIS — M1711 Unilateral primary osteoarthritis, right knee: Secondary | ICD-10-CM | POA: Insufficient documentation

## 2020-06-12 DIAGNOSIS — I4891 Unspecified atrial fibrillation: Secondary | ICD-10-CM | POA: Insufficient documentation

## 2020-06-12 DIAGNOSIS — M1990 Unspecified osteoarthritis, unspecified site: Secondary | ICD-10-CM | POA: Insufficient documentation

## 2020-06-12 DIAGNOSIS — K219 Gastro-esophageal reflux disease without esophagitis: Secondary | ICD-10-CM | POA: Insufficient documentation

## 2020-06-12 DIAGNOSIS — I499 Cardiac arrhythmia, unspecified: Secondary | ICD-10-CM | POA: Insufficient documentation

## 2020-06-18 DIAGNOSIS — M25851 Other specified joint disorders, right hip: Secondary | ICD-10-CM | POA: Diagnosis not present

## 2020-06-18 DIAGNOSIS — Z23 Encounter for immunization: Secondary | ICD-10-CM | POA: Diagnosis not present

## 2020-06-18 DIAGNOSIS — Z01818 Encounter for other preprocedural examination: Secondary | ICD-10-CM | POA: Diagnosis not present

## 2020-07-07 NOTE — Progress Notes (Signed)
Cardiology Office Note:    Date:  07/08/2020   ID:  [name], DOB [birth year], MRN [id number]  PCP:  Shirlean Mylar, MD  Cardiologist:  Norman Herrlich, MD    Referring MD: Shirlean Mylar, MD    ASSESSMENT:    1. Preoperative cardiovascular examination   2. Paroxysmal atrial fibrillation (HCC)    PLAN:    In order of problems listed above:  1. From my perspective his cardiologist he is optimized and appropriate for his planned surgical procedure.  The surgery is not high risk its elective and his only cardiac problem is atrial fibrillation he has had a good result from ablation of a long-term sinus rhythm and is not anticoagulated.  His exercise tolerance is normal he does not require any further preoperative testing.  Please put on a monitored bed postoperatively to look for recurrent atrial fibrillation check an EKG postoperative day 1. 2. Stable good long-term durable result for A. fib ablation he can withdraw his aspirin prior to surgery   Next appointment: 1 year   Medication Adjustments/Labs and Tests Ordered: Current medicines are reviewed at length with the patient today.  Concerns regarding medicines are outlined above.  Orders Placed This Encounter  Procedures  . EKG 12-Lead   No orders of the defined types were placed in this encounter.   Chief Complaint  Patient presents with  . Pre-op Exam    Prior to total knee arthroplasty    History of Present Illness:    Wyatt Duran is a 75 y.o. male with a hx of paroxysmal atrial fibrillation with EP catheter ablation 2013 last seen 01/22/2020.  Compliance with diet, lifestyle and medications: Yes  He is pending right total knee arthroplasty for severe osteoarthritis and limitations of function. He has had no recurrent atrial fibrillation since ablation and is not anticoagulated. 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  No  exertional chest pain shortness of breath or syncope exercise tolerance of 7 METS or greater. He has no risk factors for venous thromboembolism and has not had resistant bacterial infection Past Medical History:  Diagnosis Date  . Atrial fibrillation (HCC)   . Atrial flutter (HCC) 3 /13  . DJD (degenerative joint disease)   . Dysrhythmia    a-fib  . GERD (gastroesophageal reflux disease)   . Lumbar disc disease   . Syncope 2012   negative workup    Past Surgical History:  Procedure Laterality Date  . APPENDECTOMY    . ATRIAL FIBRILLATION ABLATION N/A 12/06/2011   Procedure: ATRIAL FIBRILLATION ABLATION;  Surgeon: Hillis Range, MD;  Location: [facility] CATH LAB;  Service: Cardiovascular;  Laterality: N/A;  . Atrial fibrillation and Atrial flutter ablation  12/06/11   PVI and CTI ablation by Dr Johney Frame  . BACK SURGERY    . CARDIOVERSION  11/30/2011   Procedure: CARDIOVERSION;  Surgeon: Othella Boyer, MD;  Location: [facility] OR;  Service: Cardiovascular;  Laterality: N/A;  . ENDOVENOUS ABLATION SAPHENOUS VEIN W/ LASER Left 08/08/2017   endovenous laser ablation L greater saphenous vein by Josephina Gip MD   . KNEE SURGERY    . LAPAROSCOPIC CHOLECYSTECTOMY    . LUMBAR LAMINECTOMY    . stab phlebectomy Left 11/28/2017   stab phlebectomy 10-20 incisions left leg by Josephina Gip MD   . TEE WITHOUT CARDIOVERSION  12/06/2011   Procedure: TRANSESOPHAGEAL ECHOCARDIOGRAM (TEE);  Surgeon: Dolores Patty, MD;  Location: [facility] ENDOSCOPY;  Service: Cardiovascular;  Laterality: N/A;  . TRIGGER FINGER RELEASE Left 07/12/2019   Procedure: RELEASE TRIGGER FINGER/A-1 PULLEY LEFT MIDDLE FINGER;  Surgeon: Cindee Salt, MD;  Location: [facility];  Service: Orthopedics;  Laterality: Left;  IV REGIONAL FOREARM BLOCK    Current Medications: Current Meds  Medication Sig  . aspirin 81 MG tablet Take 81 mg by mouth daily.  Marland Kitchen omeprazole (PRILOSEC) 20 MG capsule Take 1 capsule by mouth every other day.      Allergies:   Patient has no known allergies.   Social History   Socioeconomic History  . Marital status: Married    Spouse name: Not on file  . Number of children: Not on file  . Years of education: Not on file  . Highest education level: Not on file  Occupational History  . Not on file  Tobacco Use  . Smoking status: Never Smoker  . Smokeless tobacco: Never Used  Vaping Use  . Vaping Use: Never used  Substance and Sexual Activity  . Alcohol use: Yes    Comment: couple of glasses of wine per week  . Drug use: No  . Sexual activity: Not on file  Other Topics Concern  . Not on file  Social History Narrative   Pt lives in [place].  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    Social Determinants of Health   Financial Resource Strain: Not on file  Food Insecurity: Not on file  Transportation Needs: Not on file  Physical Activity: Not on file  Stress: Not on file  Social Connections: Not on file     Family History: The patient's family history includes Alcoholism in his father; Atrial fibrillation in his brother and mother; Heart attack in his father and mother. ROS:   Please see the history of present illness.    All other systems reviewed and are negative.  EKGs/Labs/Other Studies Reviewed:    The following studies were reviewed today:  EKG:  EKG ordered today and personally reviewed.  The ekg ordered today demonstrates sinus rhythm normal, he does have a nonspecific conduction delay with a QRS duration of 110 ms  Recent Labs: Recent labs 04/17/2020: Cholesterol 138 LDL at target 79 triglycerides 51 HDL 48 Creatinine 0.95  Physical Exam:    VS:  BP 140/78   Pulse 62   Ht 6' (1.829 m)   Wt 233 lb (105.7 kg)   SpO2 99%   BMI 31.60 kg/m     Wt Readings from Last 3 Encounters:  07/08/20 233 lb (105.7 kg)  01/22/20 235 lb (106.6 kg)  07/12/19 226 lb 6.6 oz (102.7 kg)     GEN: Appears healthy and younger than his age well nourished, well developed in  no acute distress HEENT: Normal NECK: No JVD; No carotid bruits LYMPHATICS: No lymphadenopathy CARDIAC: RRR, no murmurs, rubs, gallops RESPIRATORY:  Clear to auscultation without rales, wheezing or rhonchi  ABDOMEN: Soft, non-tender, non-distended MUSCULOSKELETAL:  No edema; No deformity  SKIN: Warm and dry NEUROLOGIC:  Alert and oriented x 3 PSYCHIATRIC:  Normal affect    Signed, Norman Herrlich, MD  07/08/2020 11:35 AM    [facility]

## 2020-07-08 ENCOUNTER — Other Ambulatory Visit: Payer: Self-pay

## 2020-07-08 ENCOUNTER — Ambulatory Visit (INDEPENDENT_AMBULATORY_CARE_PROVIDER_SITE_OTHER): Payer: Medicare Other | Admitting: Cardiology

## 2020-07-08 ENCOUNTER — Encounter: Payer: Self-pay | Admitting: Cardiology

## 2020-07-08 VITALS — BP 140/78 | HR 62 | Ht 72.0 in | Wt 233.0 lb

## 2020-07-08 DIAGNOSIS — Z01810 Encounter for preprocedural cardiovascular examination: Secondary | ICD-10-CM | POA: Diagnosis not present

## 2020-07-08 DIAGNOSIS — I480 Paroxysmal atrial fibrillation: Secondary | ICD-10-CM | POA: Diagnosis not present

## 2020-07-08 NOTE — Patient Instructions (Signed)

## 2020-08-21 HISTORY — PX: REPLACEMENT TOTAL KNEE: SUR1224

## 2020-12-09 ENCOUNTER — Other Ambulatory Visit: Payer: Self-pay

## 2020-12-09 ENCOUNTER — Encounter (INDEPENDENT_AMBULATORY_CARE_PROVIDER_SITE_OTHER): Payer: Medicare Other | Admitting: Ophthalmology

## 2020-12-09 DIAGNOSIS — H43813 Vitreous degeneration, bilateral: Secondary | ICD-10-CM

## 2020-12-09 DIAGNOSIS — H35372 Puckering of macula, left eye: Secondary | ICD-10-CM

## 2020-12-09 DIAGNOSIS — H35341 Macular cyst, hole, or pseudohole, right eye: Secondary | ICD-10-CM

## 2021-01-06 NOTE — Progress Notes (Signed)
Cardiology Office Note:    Date:  01/07/2021   ID:  Wyatt Duran, DOB 02-24-1946, MRN [id number]  PCP:  Maurice Small, MD  Cardiologist:  Shirlee More, MD    Referring MD: Maurice Small, MD    ASSESSMENT:    1. Paroxysmal atrial fibrillation (HCC)    PLAN:    In order of problems listed above:  He is at a good long-term result from his atrial fibrillation ablation, he will continue to monitor heart rate and rhythm will contact me through MyChart if he has a high rate irregular warning continues treatment which includes aspirin. Blood pressure was elevated repeat by me 128/76 and I told him to continue to trend his blood pressures at home   Next appointment: 1 year   Medication Adjustments/Labs and Tests Ordered: Current medicines are reviewed at length with the patient today.  Concerns regarding medicines are outlined above.  No orders of the defined types were placed in this encounter.  No orders of the defined types were placed in this encounter.   Chief Complaint  Patient presents with   Annual Exam    History of Present Illness:    Wyatt Duran is a 75 y.o. male with a hx of history of paroxysmal atrial fibrillation with EP catheter ablation last seen 07/08/2020 prior to right total knee arthroplasty maintaining sinus rhythm. Compliance with diet, lifestyle and medications: Yes  He has had a good result from orthopedic surgery full recovery and back to all activities. He has good healthcare literacy and knowledge he has a smart watch and has had no alerts for rapid or irregular heart rhythm. No edema chest pain palpitation or syncope. Home blood pressure consistently in range. Most recent labs 06/18/2020 creatinine 0.95 potassium 4.1 cholesterol at target 138 LDL 79 triglycerides 51 HDL 48 Past Medical History:  Diagnosis Date   Atrial fibrillation (HCC)    Atrial flutter ([place]) 3 /13   DJD (degenerative joint disease)    Dysrhythmia    a-fib   GERD  (gastroesophageal reflux disease)    Lumbar disc disease    Syncope 2012   negative workup    Past Surgical History:  Procedure Laterality Date   APPENDECTOMY     ATRIAL FIBRILLATION ABLATION N/A 12/06/2011   Procedure: ATRIAL FIBRILLATION ABLATION;  Surgeon: Thompson Grayer, MD;  Location[facility] CATH LAB;  Service: Cardiovascular;  Laterality: N/A;   Atrial fibrillation and Atrial flutter ablation  12/06/2011   PVI and CTI ablation by Dr Norman Herrlich SURGERY     CARDIOVERSION  11/30/2011   Procedure: CARDIOVERSION;  Surgeon: Jacolyn Reedy, MD;  Location: [place];  Service: Cardiovascular;  Laterality: N/A;   ENDOVENOUS ABLATION SAPHENOUS VEIN W/ LASER Left 08/08/2017   endovenous laser ablation L greater saphenous vein by Tinnie Gens MD    [place] Right 08/21/2020   Dr. Laurance Flatten in [place]   stab phlebectomy Left 11/28/2017   stab phlebectomy 10-20 incisions left leg by Tinnie Gens MD    TEE WITHOUT CARDIOVERSION  12/06/2011   Procedure: TRANSESOPHAGEAL ECHOCARDIOGRAM (TEE);  Surgeon: Jolaine Artist, MD;  Location: [facility] ENDOSCOPY;  Service: Cardiovascular;  Laterality: N/A;   TRIGGER FINGER RELEASE Left 07/12/2019   Procedure: RELEASE TRIGGER FINGER/A-1 PULLEY LEFT MIDDLE FINGER;  Surgeon: Daryll Brod, MD;  Location: [place];  Service: Orthopedics;  Laterality: Left;  IV REGIONAL FOREARM BLOCK    Current Medications: Current Meds  Medication Sig   aspirin 81 MG tablet Take 81 mg by mouth daily.   omeprazole (PRILOSEC) 20 MG capsule Take 1 capsule by mouth every other day.     Allergies:   Patient has no known allergies.   Social History   Socioeconomic History   Marital status: Married    Spouse name: Not on file   Number of children: Not on file   Years of education: Not on file   Highest education level: Not on file  Occupational History   Not on file  Tobacco  Use   Smoking status: Never   Smokeless tobacco: Never  Vaping Use   Vaping Use: Never used  Substance and Sexual Activity   Alcohol use: Yes    Comment: couple of glasses of wine per week   Drug use: No   Sexual activity: Not on file  Other Topics Concern   Not on file  Social History Narrative   Pt lives in [place].  Consultant in Advertising copywriter.    Social Determinants of Health   Financial Resource Strain: Not on file  Food Insecurity: Not on file  Transportation Needs: Not on file  Physical Activity: Not on file  Stress: Not on file  Social Connections: Not on file     Family History: The patient's family history includes Alcoholism in his father; Atrial fibrillation in his brother and mother; Heart attack in his father and mother. ROS:   Please see the history of present illness.    All other systems reviewed and are negative.  EKGs/Labs/Other Studies Reviewed:    The following studies were reviewed today:  Physical Exam:    VS:  BP 128/76   Pulse (!) 52   Ht 6' (1.829 m)   Wt 227 lb (103 kg)   SpO2 95%   BMI 30.79 kg/m     Wt Readings from Last 3 Encounters:  01/07/21 227 lb (103 kg)  07/08/20 233 lb (105.7 kg)  01/22/20 235 lb (106.6 kg)     GEN:  Well nourished, well developed in no acute distress HEENT: Normal NECK: No JVD; No carotid bruits LYMPHATICS: No lymphadenopathy CARDIAC: RRR, no murmurs, rubs, gallops RESPIRATORY:  Clear to auscultation without rales, wheezing or rhonchi  ABDOMEN: Soft, non-tender, non-distended MUSCULOSKELETAL:  No edema; No deformity  SKIN: Warm and dry NEUROLOGIC:  Alert and oriented x 3 PSYCHIATRIC:  Normal affect    Signed, Shirlee More, MD  01/07/2021 2:21 PM    [facility]

## 2021-01-07 ENCOUNTER — Ambulatory Visit (INDEPENDENT_AMBULATORY_CARE_PROVIDER_SITE_OTHER): Payer: Medicare Other | Admitting: Cardiology

## 2021-01-07 ENCOUNTER — Encounter: Payer: Self-pay | Admitting: Cardiology

## 2021-01-07 ENCOUNTER — Other Ambulatory Visit: Payer: Self-pay

## 2021-01-07 VITALS — BP 128/76 | HR 52 | Ht 72.0 in | Wt 227.0 lb

## 2021-01-07 DIAGNOSIS — I480 Paroxysmal atrial fibrillation: Secondary | ICD-10-CM

## 2021-01-07 NOTE — Patient Instructions (Signed)

## 2021-04-09 ENCOUNTER — Telehealth: Payer: Self-pay | Admitting: Cardiology

## 2021-04-09 NOTE — Telephone Encounter (Signed)
Received call from patient wanting to make an appointment ASAP with Dr.Munley. Patient stated his normal HR is anywhere from 68-72, the last few days patients HR has been 110 and higher. Patient mentioned he has had AFIB but it has been awhile. Appointment was made with Dr.Munley in [place] Monday 04/12/21. Advised patient that I would send message to triage and he should be expecting a call form a nurse.

## 2021-04-09 NOTE — Telephone Encounter (Signed)
Patient was returning call 

## 2021-04-09 NOTE — Telephone Encounter (Signed)
Left message on patients voicemail to please return our call.   

## 2021-04-11 NOTE — Progress Notes (Signed)
Thank you for his Cardiology Office Note:    Date:  04/12/2021   ID:  Wyatt Duran, DOB 09/13/1945, MRN [id number]  PCP:  Maurice Small, MD (Inactive)  Cardiologist:  Shirlee More, MD    Referring MD: No ref. provider found    ASSESSMENT:    1. Paroxysmal atrial fibrillation (HCC)   2. Chronic anticoagulation    PLAN:    In order of problems listed above:  Unfortunately despite an excellent result from atrial fibrillation ablation he is back in the arrhythmia he is hesitant to accept antiarrhythmic drugs with or without urgent of the switch from aspirin to coagulant and referred back to me for evaluation and request Dr. Rayann Heman.   Next appointment: As needed   Medication Adjustments/Labs and Tests Ordered: Current medicines are reviewed at length with the patient today.  Concerns regarding medicines are outlined above.  No orders of the defined types were placed in this encounter.  No orders of the defined types were placed in this encounter.   Chief Complaint  Patient presents with   Follow-up   Atrial Fibrillation    History of Present Illness:    ASHAR LEWINSKI is a 75 y.o. male with a hx of paroxysmal atrial fibrillation with EP catheter ablation 07/09/2011 and hypertensive heart disease last seen 01/07/2021.  He phoned our office 04/09/2021 to tell us he has been in atrial fibrillation with heart rates of 110 beats or greater.  Compliance with diet, lifestyle and medications: Yes  Call the office 04/09/2021.  Recurrent atrial fibrillation he is bothered by palpitation and some exertional weakness and shortness of breath. Previously did not respond to antiarrhythmic drugs. He would like to see Dr. Rayann Heman in consultation Is going to be leaving next few months with her to stop aspirin no placement his anticoagulant today his heart rate is 109 bpm controlled on rate slowing medications and see if we can quickly set him up for outpatient evaluation options  cardioversion with or without antiarrhythmic drugs versus redo EP catheter ablation. Past Medical History:  Diagnosis Date   Atrial fibrillation [facility])    Atrial flutter ([employer]) 3 /13   DJD (degenerative joint disease)    Dysrhythmia    a-fib   GERD (gastroesophageal reflux disease)    Lumbar disc disease    Syncope 2012   negative workup    Past Surgical History:  Procedure Laterality Date   APPENDECTOMY     ATRIAL FIBRILLATION ABLATION N/A 12/06/2011   Procedure: ATRIAL FIBRILLATION ABLATION;  Surgeon: Thompson Grayer, MD;  Location: [facility] CATH LAB;  Service: Cardiovascular;  Laterality: N/A;   Atrial fibrillation and Atrial flutter ablation  12/06/2011   PVI and CTI ablation by Dr Norman Herrlich SURGERY     CARDIOVERSION  11/30/2011   Procedure: CARDIOVERSION;  Surgeon: Jacolyn Reedy, MD;  Location: [place];  Service: Cardiovascular;  Laterality: N/A;   ENDOVENOUS ABLATION SAPHENOUS VEIN W/ LASER Left 08/08/2017   endovenous laser ablation L greater saphenous vein by Tinnie Gens MD    [place] Right 08/21/2020   [place]   stab phlebectomy Left 11/28/2017   stab phlebectomy 10-20 incisions left leg by Tinnie Gens MD    TEE WITHOUT CARDIOVERSION  12/06/2011   Procedure: TRANSESOPHAGEAL ECHOCARDIOGRAM (TEE);  Surgeon: Jolaine Artist, MD;  Location: [place];  Service: Cardiovascular;  Laterality:  N/A;   TRIGGER FINGER RELEASE Left 07/12/2019   Procedure: RELEASE TRIGGER FINGER/A-1 PULLEY LEFT MIDDLE FINGER;  Surgeon: Daryll Brod, MD;  Location: [place];  Service: Orthopedics;  Laterality: Left;  IV REGIONAL FOREARM BLOCK    Current Medications: Current Meds  Medication Sig   aspirin 81 MG tablet Take 81 mg by mouth daily.   omeprazole (PRILOSEC) 20 MG capsule Take 1 capsule by mouth every other day.     Allergies:   Patient has no known allergies.    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    Social Determinants of Health   Financial Resource Strain: Not on file  Food Insecurity: Not on file  Transportation Needs: Not on file  Physical Activity: Not on file  Stress: Not on file  Social Connections: Not on file     Family History: The patient's family history includes Alcoholism in his father; Atrial fibrillation in his brother and mother; Heart attack in his father and mother. ROS:   Please see the history of present illness.    All other systems reviewed and are negative.  EKGs/Labs/Other Studies Reviewed:    The following studies were reviewed today:  EKG:  EKG ordered today and personally reviewed.  The ekg ordered today demonstrates atrial fibrillation pattern and incomplete right bundle branch block Most recent labs 04/17/2020 cholesterol 138 LDL 79 triglycerides 51 HDL 49 hemoglobin 14.3 creatinine 0.95 potassium 4.1  Physical Exam:    VS:  BP 132/88 (BP Location: Right Arm, Patient Position: Sitting)   Pulse (!) 109   Ht 6' (1.829 m)   Wt 227 lb 9.6 oz (103.2 kg)   SpO2 98%   BMI 30.87 kg/m     Wt Readings from Last 3 Encounters:  04/12/21 227 lb 9.6 oz (103.2 kg)  01/07/21 227 lb (103 kg)  07/08/20 233 lb (105.7 kg)     GEN:  Well nourished, well developed in no acute distress HEENT: Normal NECK: No JVD; No carotid  bruits LYMPHATICS: No lymphadenopathy CARDIAC: RRR, no murmurs, rubs, gallops RESPIRATORY:  Clear to auscultation without rales, wheezing or rhonchi  ABDOMEN: Soft, non-tender, non-distended MUSCULOSKELETAL:  No edema; No deformity  SKIN: Warm and dry NEUROLOGIC:  Alert and oriented x 3 PSYCHIATRIC:  Normal affect    Signed, Shirlee More, MD  04/12/2021 2:40 PM    [facility]

## 2021-04-12 ENCOUNTER — Encounter: Payer: Self-pay | Admitting: Cardiology

## 2021-04-12 ENCOUNTER — Other Ambulatory Visit: Payer: Self-pay

## 2021-04-12 ENCOUNTER — Ambulatory Visit (INDEPENDENT_AMBULATORY_CARE_PROVIDER_SITE_OTHER): Payer: Medicare Other | Admitting: Cardiology

## 2021-04-12 VITALS — BP 132/88 | HR 109 | Ht 72.0 in | Wt 227.6 lb

## 2021-04-12 DIAGNOSIS — I480 Paroxysmal atrial fibrillation: Secondary | ICD-10-CM

## 2021-04-12 DIAGNOSIS — Z7901 Long term (current) use of anticoagulants: Secondary | ICD-10-CM | POA: Diagnosis not present

## 2021-04-12 MED ORDER — RIVAROXABAN 20 MG PO TABS: 20.0000 mg | ORAL_TABLET | Freq: Every day | ORAL | 3 refills | Status: DC

## 2021-04-12 NOTE — Telephone Encounter (Signed)
Patient is coming in today at 2:20 and this will be discussed with him at this time.

## 2021-04-12 NOTE — Patient Instructions (Addendum)
Medication Instructions:  Your physician has recommended you make the following change in your medication:  START: Xarelto 20 mg take one tablet by mouth daily.  STOP: Aspirin *If you need a refill on your cardiac medications before your next appointment, please call your pharmacy*   Lab Work: Your physician recommends that you return for lab work in: [place], [place] If you have labs (blood work) drawn today and your tests are completely normal, you will receive your results only by: [place] (if you have MyChart) OR A paper copy in the mail If you have any lab test that is abnormal or we need to change your treatment, we will call you to review the results.   Testing/Procedures: None   Follow-Up: At [facility], you and your health needs are our priority.  As part of our continuing mission to provide you with exceptional heart care, we have created designated Provider Care Teams.  These Care Teams include your primary Cardiologist (physician) and Advanced Practice Providers (APPs -  Physician Assistants and Nurse Practitioners) who all work together to provide you with the care you need, when you need it.  We recommend signing up for the patient portal called "MyChart".  Sign up information is provided on this After Visit Summary.  MyChart is used to connect with patients for Virtual Visits (Telemedicine).  Patients are able to view lab/test results, encounter notes, upcoming appointments, etc.  Non-urgent messages can be sent to your provider as well.   To learn more about what you can do with MyChart, go to [email or web address].    Your next appointment:   As needed  The format for your next appointment:   In Person  Provider:   Shirlee More, MD   Other Instructions

## 2021-04-13 ENCOUNTER — Telehealth: Payer: Self-pay

## 2021-04-13 LAB — BASIC METABOLIC PANEL
BUN/Creatinine Ratio: 13 (ref 10–24)
BUN: 14 mg/dL (ref 8–27)
CO2: 22 mmol/L (ref 20–29)
Calcium: 9.5 mg/dL (ref 8.6–10.2)
Chloride: 106 mmol/L (ref 96–106)
Creatinine, Ser: 1.12 mg/dL (ref 0.76–1.27)
Glucose: 92 mg/dL (ref 70–99)
Potassium: 4.3 mmol/L (ref 3.5–5.2)
Sodium: 144 mmol/L (ref 134–144)
eGFR: 68 mL/min/{1.73_m2} (ref >59)

## 2021-04-13 LAB — CBC
Hematocrit: 43.6 % (ref 37.5–51.0)
Hemoglobin: 14.3 g/dL (ref 13.0–17.7)
MCH: 29.5 pg (ref 26.6–33.0)
MCHC: 32.8 g/dL (ref 31.5–35.7)
MCV: 90 fL (ref 79–97)
Platelets: 230 10*3/uL (ref 150–450)
RBC: 4.85 x10E6/uL (ref 4.14–5.80)
RDW: 13.2 % (ref 11.6–15.4)
WBC: 6.7 10*3/uL (ref 3.4–10.8)

## 2021-04-13 NOTE — Telephone Encounter (Signed)
Spoke with patient regarding results and recommendation.  Patient verbalizes understanding and is agreeable to plan of care. Advised patient to call back with any issues or concerns.  

## 2021-04-13 NOTE — Telephone Encounter (Signed)
-----   Message from Richardo Priest, MD sent at 04/13/2021  7:49 AM EDT ----- Normal or stable result  No change in treatment

## 2021-04-26 ENCOUNTER — Other Ambulatory Visit: Payer: Self-pay

## 2021-04-26 ENCOUNTER — Ambulatory Visit (INDEPENDENT_AMBULATORY_CARE_PROVIDER_SITE_OTHER): Payer: Medicare Other | Admitting: Internal Medicine

## 2021-04-26 ENCOUNTER — Telehealth (HOSPITAL_COMMUNITY): Payer: Self-pay | Admitting: Physician Assistant

## 2021-04-26 VITALS — BP 140/78 | HR 125 | Ht 72.0 in | Wt 222.8 lb

## 2021-04-26 DIAGNOSIS — I4819 Other persistent atrial fibrillation: Secondary | ICD-10-CM

## 2021-04-26 DIAGNOSIS — I480 Paroxysmal atrial fibrillation: Secondary | ICD-10-CM

## 2021-04-26 DIAGNOSIS — D6869 Other thrombophilia: Secondary | ICD-10-CM

## 2021-04-26 MED ORDER — DILTIAZEM HCL ER COATED BEADS 180 MG PO CP24: 180.0000 mg | ORAL_CAPSULE | Freq: Every day | ORAL | 3 refills | Status: DC

## 2021-04-26 NOTE — Patient Instructions (Addendum)
Medication Instructions:  Start Diltiazem 180 mg daily Your physician recommends that you continue on your current medications as directed. Please refer to the Current Medication list given to you today. *If you need a refill on your cardiac medications before your next appointment, please call your pharmacy*  Lab Work: None. If you have labs (blood work) drawn today and your tests are completely normal, you will receive your results only by: [place] (if you have MyChart) OR A paper copy in the mail If you have any lab test that is abnormal or we need to change your treatment, we will call you to review the results.  Testing/Procedures: Your physician has recommended that you have a Cardioversion (DCCV). Electrical Cardioversion uses a jolt of electricity to your heart either through paddles or wired patches attached to your chest. This is a controlled, usually prescheduled, procedure. Defibrillation is done under light anesthesia in the hospital, and you usually go home the day of the procedure. This is done to get your heart back into a normal rhythm. You are not awake for the procedure. Please see the instruction sheet given to you today.   Follow-Up: At [facility], you and your health needs are our priority.  As part of our continuing mission to provide you with exceptional heart care, we have created designated Provider Care Teams.  These Care Teams include your primary Cardiologist (physician) and Advanced Practice Providers (APPs -  Physician Assistants and Nurse Practitioners) who all work together to provide you with the care you need, when you need it.  Your physician wants you to follow-up in: 1 week after your cardioversion follow up with the Afib Clinic. They will contact you to schedule. 06/21/21 at 10 am with  Thompson Grayer, MD   We recommend signing up for the patient portal called "MyChart".  Sign up information is provided on this After Visit Summary.  MyChart is  used to connect with patients for Virtual Visits (Telemedicine).  Patients are able to view lab/test results, encounter notes, upcoming appointments, etc.  Non-urgent messages can be sent to your provider as well.   To learn more about what you can do with MyChart, go to [email or web address].    Any Other Special Instructions Will Be Listed Below (If Applicable).   You are scheduled for a Cardioversion on May 04, 2021 with Dr. Gasper Sells.  Please arrive at the [facility] (Main Entrance A) at [facility]: [address] [place], [place] [address] at 8 am.  DIET: Nothing to eat or drink after midnight except a sip of water with medications (see medication instructions below)  Medication Instructions: Take all AM medications   Continue your anticoagulant: Xarelto You will need to continue your anticoagulant after your procedure until you  are told by your  Provider that it is safe to stop   You must have a responsible person to drive you home and stay in the waiting area during your procedure. Failure to do so could result in cancellation.  Bring your insurance cards.  *Special Note: Every effort is made to have your procedure done on time. Occasionally there are emergencies that occur at the hospital that may cause delays. Please be patient if a delay does occur.

## 2021-04-26 NOTE — Telephone Encounter (Signed)
Called and left message for patient to call back.  Pt needs 1 week f/u after dccv on 05/04/21 per Saddie Benders, RN.

## 2021-04-26 NOTE — H&P (View-Only) (Signed)
Electrophysiology Office Note   Date:  04/26/2021   ID:  ANGELDEJESUS CALLAHAM, DOB [birth year], MRN [id number]  PCP:  Maurice Small, MD (Inactive)  Cardiologist:  Dr Bettina Gavia Primary Electrophysiologist: Thompson Grayer, MD    CC: afib   History of Present Illness: Wyatt Duran is a 75 y.o. male who presents today for electrophysiology evaluation.   He is referred by Dr Bettina Gavia for EP consult regarding afib.  He is s/p prior ablation by me.  I have not seen him since 2014.  He was diagnosed with afib on routine physical in 2013.  He had atrial flutter as well.  He failed medical therapy with flecainide.  He underwent PVI and CTI ablations by me 12/06/2011. His afib initially resolved. He developed recurrent afib 04/09/21.  + palpitations and fatigue.  He was evaluated by Dr Bettina Gavia and started on xarelto 04/12/21.  He reports compliance without interruption since then. He was diagnosed with COVID 04/14/21.  In retrospect, it could have been covid that caused his AF recurrence.  Today, he denies symptoms of chest pain, shortness of breath, orthopnea, PND, lower extremity edema, claudication, dizziness, presyncope, syncope, bleeding, or neurologic sequela. The patient is tolerating medications without difficulties and is otherwise without complaint today.    Past Medical History:  Diagnosis Date   Atrial fibrillation [facility])    Atrial flutter ([place]) 3 /13   DJD (degenerative joint disease)    Dysrhythmia    a-fib   GERD (gastroesophageal reflux disease)    Lumbar disc disease    Syncope 2012   negative workup   Past Surgical History:  Procedure Laterality Date   APPENDECTOMY     ATRIAL FIBRILLATION ABLATION N/A 12/06/2011   Procedure: ATRIAL FIBRILLATION ABLATION;  Surgeon: Thompson Grayer, MD;  Location: [facility] CATH LAB;  Service: Cardiovascular;  Laterality: N/A;   Atrial fibrillation and Atrial flutter ablation  12/06/2011   PVI and CTI ablation by Dr Norman Herrlich SURGERY      CARDIOVERSION  11/30/2011   Procedure: CARDIOVERSION;  Surgeon: Jacolyn Reedy, MD;  Location: [place];  Service: Cardiovascular;  Laterality: N/A;   ENDOVENOUS ABLATION SAPHENOUS VEIN W/ LASER Left 08/08/2017   endovenous laser ablation L greater saphenous vein by Tinnie Gens MD    [place] Right 08/21/2020   Dr. Laurance Flatten in [place]   stab phlebectomy Left 11/28/2017   stab phlebectomy 10-20 incisions left leg by Tinnie Gens MD    TEE WITHOUT CARDIOVERSION  12/06/2011   Procedure: TRANSESOPHAGEAL ECHOCARDIOGRAM (TEE);  Surgeon: Jolaine Artist, MD;  Location: [facility] ENDOSCOPY;  Service: Cardiovascular;  Laterality: N/A;   TRIGGER FINGER RELEASE Left 07/12/2019   Procedure: RELEASE TRIGGER FINGER/A-1 PULLEY LEFT MIDDLE FINGER;  Surgeon: Daryll Brod, MD;  Location: [place];  Service: Orthopedics;  Laterality: Left;  IV REGIONAL FOREARM BLOCK     Current Outpatient Medications  Medication Sig Dispense Refill   omeprazole (PRILOSEC) 20 MG capsule Take 1 capsule by mouth every other day.     rivaroxaban (XARELTO) 20 MG TABS tablet Take 1 tablet (20 mg total) by mouth daily with supper. 90 tablet 3   No current facility-administered medications for this visit.    Allergies:   Patient has no known allergies.   Social History:  The patient  reports that he has never smoked. He has never used smokeless tobacco.  He reports current alcohol use. He reports that he does not use drugs.   Family History:  The patient's family history includes Alcoholism in his father; Atrial fibrillation in his brother and mother; Heart attack in his father and mother.    ROS:  Please see the history of present illness.   All other systems are personally reviewed and negative.    PHYSICAL EXAM: VS:  BP 140/78   Pulse (!) 125   Ht 6' (1.829 m)   Wt 222 lb 12.8 oz (101.1 kg)   SpO2 92%   BMI 30.22 kg/m   , BMI Body mass index is 30.22 kg/m. GEN: Well nourished, well developed, in no acute distress HEENT: normal Neck: no JVD, carotid bruits, or masses Cardiac: tachycardic irregular rhythm Respiratory:  clear to auscultation bilaterally, normal work of breathing GI: soft, nontender, nondistended, + BS MS: no deformity or atrophy Skin: warm and dry  Neuro:  Strength and sensation are intact Psych: euthymic mood, full affect  EKG:  EKG is ordered today. The ekg ordered today is personally reviewed and shows afib with V rates 125 bpm   Recent Labs: 04/12/2021: BUN 14; Creatinine, Ser 1.12; Hemoglobin 14.3; Platelets 230; Potassium 4.3; Sodium 144  personally reviewed   Lipid Panel  No results found for: CHOL, TRIG, HDL, CHOLHDL, VLDL, LDLCALC, LDLDIRECT personally reviewed   Wt Readings from Last 3 Encounters:  04/26/21 222 lb 12.8 oz (101.1 kg)  04/12/21 227 lb 9.6 oz (103.2 kg)  01/07/21 227 lb (103 kg)      Other studies personally reviewed: Additional studies/ records that were reviewed today include: Dr Oren Binet notes, my prior notes, prior echo  Review of the above records today demonstrates: as above   ASSESSMENT AND PLAN:  1.  Persistent afib First recurrence post ablation in 2013.  This occurred several days before the diagnosis of COVID.   I could be that COVID is the cause of this recurrence.  As this is his only episode in over 10 years, I think our best first step is to proceed with [facility].  He will have been anticoagulated for 3 weeks on 05/03/21.  We will therefore schedule [facility] anytime after that. We discussed risks of [place] at length today including but not limited to anesthesia risks and stroke.  He understands and wishes to proceed.  Start diltiazem CD 180mg  daily today for rate control.  If he has further short term recurrence of his afib, we will proceed to ablation.  Risks, benefits and potential toxicities for medications prescribed and/or refilled reviewed  with patient today.    Follow-up:  AF clinic 1 week after cardioversion.  I will see 6 weeks after [facility]. We should obtain an echo once he is back in sinus rhythm.     Army Fossa, MD  04/26/2021 11:04 AM     [facility] [address] [place] [address] [phone] (office) [phone] (fax)

## 2021-04-26 NOTE — Progress Notes (Signed)
Electrophysiology Office Note   Date:  04/26/2021   ID:  JOSEALFREDO ADKINS, DOB 09/09/1945, MRN [id number]  PCP:  Maurice Small, MD (Inactive)  Cardiologist:  Dr Bettina Gavia Primary Electrophysiologist: Thompson Grayer, MD    CC: afib   History of Present Illness: Wyatt Duran is a 75 y.o. male who presents today for electrophysiology evaluation.   He is referred by Dr Bettina Gavia for EP consult regarding afib.  He is s/p prior ablation by me.  I have not seen him since 2014.  He was diagnosed with afib on routine physical in 2013.  He had atrial flutter as well.  He failed medical therapy with flecainide.  He underwent PVI and CTI ablations by me 12/06/2011. His afib initially resolved. He developed recurrent afib 04/09/21.  + palpitations and fatigue.  He was evaluated by Dr Bettina Gavia and started on xarelto 04/12/21.  He reports compliance without interruption since then. He was diagnosed with COVID 04/14/21.  In retrospect, it could have been covid that caused his AF recurrence.  Today, he denies symptoms of chest pain, shortness of breath, orthopnea, PND, lower extremity edema, claudication, dizziness, presyncope, syncope, bleeding, or neurologic sequela. The patient is tolerating medications without difficulties and is otherwise without complaint today.    Past Medical History:  Diagnosis Date   Atrial fibrillation [facility])    Atrial flutter ([place]) 3 /13   DJD (degenerative joint disease)    Dysrhythmia    a-fib   GERD (gastroesophageal reflux disease)    Lumbar disc disease    Syncope 2012   negative workup   Past Surgical History:  Procedure Laterality Date   APPENDECTOMY     ATRIAL FIBRILLATION ABLATION N/A 12/06/2011   Procedure: ATRIAL FIBRILLATION ABLATION;  Surgeon: Thompson Grayer, MD;  Location: [facility] CATH LAB;  Service: Cardiovascular;  Laterality: N/A;   Atrial fibrillation and Atrial flutter ablation  12/06/2011   PVI and CTI ablation by Dr Norman Herrlich SURGERY      CARDIOVERSION  11/30/2011   Procedure: CARDIOVERSION;  Surgeon: Jacolyn Reedy, MD;  Location: [place];  Service: Cardiovascular;  Laterality: N/A;   ENDOVENOUS ABLATION SAPHENOUS VEIN W/ LASER Left 08/08/2017   endovenous laser ablation L greater saphenous vein by Tinnie Gens MD    [place] Right 08/21/2020   Dr. Laurance Flatten in [place]   stab phlebectomy Left 11/28/2017   stab phlebectomy 10-20 incisions left leg by Tinnie Gens MD    TEE WITHOUT CARDIOVERSION  12/06/2011   Procedure: TRANSESOPHAGEAL ECHOCARDIOGRAM (TEE);  Surgeon: Jolaine Artist, MD;  Location: [facility] ENDOSCOPY;  Service: Cardiovascular;  Laterality: N/A;   TRIGGER FINGER RELEASE Left 07/12/2019   Procedure: RELEASE TRIGGER FINGER/A-1 PULLEY LEFT MIDDLE FINGER;  Surgeon: Daryll Brod, MD;  Location: [place];  Service: Orthopedics;  Laterality: Left;  IV REGIONAL FOREARM BLOCK     Current Outpatient Medications  Medication Sig Dispense Refill   omeprazole (PRILOSEC) 20 MG capsule Take 1 capsule by mouth every other day.     rivaroxaban (XARELTO) 20 MG TABS tablet Take 1 tablet (20 mg total) by mouth daily with supper. 90 tablet 3   No current facility-administered medications for this visit.    Allergies:   Patient has no known allergies.   Social History:  The patient  reports that he has never smoked. He has never used smokeless tobacco.  He reports current alcohol use. He reports that he does not use drugs.   Family History:  The patient's family history includes Alcoholism in his father; Atrial fibrillation in his brother and mother; Heart attack in his father and mother.    ROS:  Please see the history of present illness.   All other systems are personally reviewed and negative.    PHYSICAL EXAM: VS:  BP 140/78   Pulse (!) 125   Ht 6' (1.829 m)   Wt 222 lb 12.8 oz (101.1 kg)   SpO2 92%   BMI 30.22 kg/m   , BMI Body mass index is 30.22 kg/m. GEN: Well nourished, well developed, in no acute distress HEENT: normal Neck: no JVD, carotid bruits, or masses Cardiac: tachycardic irregular rhythm Respiratory:  clear to auscultation bilaterally, normal work of breathing GI: soft, nontender, nondistended, + BS MS: no deformity or atrophy Skin: warm and dry  Neuro:  Strength and sensation are intact Psych: euthymic mood, full affect  EKG:  EKG is ordered today. The ekg ordered today is personally reviewed and shows afib with V rates 125 bpm   Recent Labs: 04/12/2021: BUN 14; Creatinine, Ser 1.12; Hemoglobin 14.3; Platelets 230; Potassium 4.3; Sodium 144  personally reviewed   Lipid Panel  No results found for: CHOL, TRIG, HDL, CHOLHDL, VLDL, LDLCALC, LDLDIRECT personally reviewed   Wt Readings from Last 3 Encounters:  04/26/21 222 lb 12.8 oz (101.1 kg)  04/12/21 227 lb 9.6 oz (103.2 kg)  01/07/21 227 lb (103 kg)      Other studies personally reviewed: Additional studies/ records that were reviewed today include: Dr Oren Binet notes, my prior notes, prior echo  Review of the above records today demonstrates: as above   ASSESSMENT AND PLAN:  1.  Persistent afib First recurrence post ablation in 2013.  This occurred several days before the diagnosis of COVID.   I could be that COVID is the cause of this recurrence.  As this is his only episode in over 10 years, I think our best first step is to proceed with [facility].  He will have been anticoagulated for 3 weeks on 05/03/21.  We will therefore schedule [facility] anytime after that. We discussed risks of [place] at length today including but not limited to anesthesia risks and stroke.  He understands and wishes to proceed.  Start diltiazem CD 180mg  daily today for rate control.  If he has further short term recurrence of his afib, we will proceed to ablation.  Risks, benefits and potential toxicities for medications prescribed and/or refilled reviewed  with patient today.    Follow-up:  AF clinic 1 week after cardioversion.  I will see 6 weeks after [facility]. We should obtain an echo once he is back in sinus rhythm.     Army Fossa, MD  04/26/2021 11:04 AM     [facility] [address] [place] [address] [phone] (office) [phone] (fax)

## 2021-04-27 ENCOUNTER — Encounter (HOSPITAL_COMMUNITY): Payer: Self-pay | Admitting: Internal Medicine

## 2021-04-28 NOTE — Progress Notes (Signed)
Attempted to obtain medical history via telephone, unable to reach at this time. I left a voicemail to return pre surgical testing department's phone call.  

## 2021-05-04 ENCOUNTER — Encounter (HOSPITAL_COMMUNITY): Payer: Self-pay | Admitting: Internal Medicine

## 2021-05-04 ENCOUNTER — Ambulatory Visit (HOSPITAL_COMMUNITY): Payer: Medicare Other | Admitting: Anesthesiology

## 2021-05-04 ENCOUNTER — Encounter (HOSPITAL_COMMUNITY)
Admission: RE | Disposition: A | Discharge status: Home / Self Care | Payer: Self-pay | Source: Home / Self Care | Attending: Internal Medicine

## 2021-05-04 ENCOUNTER — Ambulatory Visit (HOSPITAL_COMMUNITY)
Admission: RE | Admit: 2021-05-04 | Discharge: 2021-05-04 | Disposition: A | Discharge status: Home / Self Care | Payer: Medicare Other | Attending: Internal Medicine | Admitting: Internal Medicine

## 2021-05-04 ENCOUNTER — Other Ambulatory Visit: Payer: Self-pay

## 2021-05-04 DIAGNOSIS — I4892 Unspecified atrial flutter: Secondary | ICD-10-CM | POA: Diagnosis not present

## 2021-05-04 DIAGNOSIS — I480 Paroxysmal atrial fibrillation: Secondary | ICD-10-CM

## 2021-05-04 DIAGNOSIS — Z8249 Family history of ischemic heart disease and other diseases of the circulatory system: Secondary | ICD-10-CM | POA: Insufficient documentation

## 2021-05-04 DIAGNOSIS — Z7901 Long term (current) use of anticoagulants: Secondary | ICD-10-CM | POA: Insufficient documentation

## 2021-05-04 DIAGNOSIS — I4891 Unspecified atrial fibrillation: Secondary | ICD-10-CM | POA: Diagnosis not present

## 2021-05-04 DIAGNOSIS — I4819 Other persistent atrial fibrillation: Secondary | ICD-10-CM | POA: Diagnosis present

## 2021-05-04 DIAGNOSIS — Z8616 Personal history of COVID-19: Secondary | ICD-10-CM | POA: Diagnosis not present

## 2021-05-04 DIAGNOSIS — Z79899 Other long term (current) drug therapy: Secondary | ICD-10-CM | POA: Diagnosis not present

## 2021-05-04 HISTORY — PX: CARDIOVERSION: SHX1299

## 2021-05-04 LAB — POCT I-STAT, CHEM 8
BUN: 23 mg/dL (ref 8–23)
Calcium, Ion: 0.96 mmol/L — ABNORMAL LOW (ref 1.15–1.40)
Chloride: 113 mmol/L — ABNORMAL HIGH (ref 98–111)
Creatinine, Ser: 1 mg/dL (ref 0.61–1.24)
Glucose, Bld: 106 mg/dL — ABNORMAL HIGH (ref 70–99)
HCT: 46 % (ref 39.0–52.0)
Hemoglobin: 15.6 g/dL (ref 13.0–17.0)
Potassium: 4.3 mmol/L (ref 3.5–5.1)
Sodium: 140 mmol/L (ref 135–145)
TCO2: 21 mmol/L — ABNORMAL LOW (ref 22–32)

## 2021-05-04 SURGERY — CARDIOVERSION
Anesthesia: Monitor Anesthesia Care

## 2021-05-04 MED ORDER — LIDOCAINE 2% (20 MG/ML) 5 ML SYRINGE
INTRAMUSCULAR | Status: DC | PRN
  Administered 2021-05-04: 50 mg via INTRAVENOUS

## 2021-05-04 MED ORDER — SODIUM CHLORIDE 0.9 % IV SOLN: INTRAVENOUS | Status: DC

## 2021-05-04 MED ORDER — PROPOFOL 10 MG/ML IV BOLUS
INTRAVENOUS | Status: DC | PRN
  Administered 2021-05-04: 80 mg via INTRAVENOUS

## 2021-05-04 NOTE — Transfer of Care (Signed)
Immediate Anesthesia Transfer of Care Note  Patient: Wyatt Duran  Procedure(s) Performed: CARDIOVERSION  Patient Location: Endoscopy Unit  Anesthesia Type:General  Level of Consciousness: awake and alert   Airway & Oxygen Therapy: Patient Spontanous Breathing  Post-op Assessment: Report given to RN and Post -op Vital signs reviewed and stable  Post vital signs: Reviewed and stable  Last Vitals:  Vitals Value Taken Time  BP 126/87 05/04/21 0957  Temp    Pulse 77 05/04/21 0957  Resp 11 05/04/21 0957  SpO2 98 % 05/04/21 0957  Vitals shown include unvalidated device data.  Last Pain:  Vitals:   05/04/21 0854  TempSrc: Oral  PainSc: 0-No pain         Complications: No notable events documented.

## 2021-05-04 NOTE — Anesthesia Postprocedure Evaluation (Signed)
Anesthesia Post Note  Patient: Wyatt Duran  Procedure(s) Performed: CARDIOVERSION     Patient location during evaluation: PACU Anesthesia Type: MAC Level of consciousness: awake and alert Pain management: pain level controlled Vital Signs Assessment: post-procedure vital signs reviewed and stable Respiratory status: spontaneous breathing, nonlabored ventilation, respiratory function stable and patient connected to nasal cannula oxygen Cardiovascular status: stable and blood pressure returned to baseline Postop Assessment: no apparent nausea or vomiting Anesthetic complications: no   No notable events documented.  Last Vitals:  Vitals:   05/04/21 1010 05/04/21 1020  BP: 122/81 107/88  Pulse: 76 80  Resp: 15 (!) 21  Temp:    SpO2: 99% 98%    Last Pain:  Vitals:   05/04/21 1010  TempSrc:   PainSc: 0-No pain                 Nicollette Wilhelmi

## 2021-05-04 NOTE — Interval H&P Note (Signed)
History and Physical Interval Note:  05/04/2021 9:45 AM  Wyatt Duran  has presented today for surgery, with the diagnosis of AFIB.  The various methods of treatment have been discussed with the patient and family. After consideration of risks, benefits and other options for treatment, the patient has consented to  Procedure(s): CARDIOVERSION (N/A) as a surgical intervention.  The patient's history has been reviewed, patient examined, no change in status, stable for surgery.  I have reviewed the patient's chart and labs.  Questions were answered to the patient's satisfaction.     Freddi Forster A Marquavious Nazar

## 2021-05-04 NOTE — Discharge Instructions (Signed)

## 2021-05-04 NOTE — CV Procedure (Signed)
   Electrical Cardioversion Procedure Note MAXAMUS COLAO [id number] Dec 11, 1945  Procedure: Electrical Cardioversion Indications:  Atrial Fibrillation  Time Out: Verified patient identification, verified procedure,medications/allergies/relevent history reviewed, required imaging and test results available.  Performed  Procedure Details  The patient was NPO after midnight. Anesthesia was administered at the beside  by Dr.Oddono with 50mg  of lidocaine and 80 mg propofol.  Cardioversion was done with synchronized biphasic defibrillation with AP pads with 200 Joules.  The patient converted to normal sinus rhythm. The patient tolerated the procedure well   IMPRESSION:  Successful cardioversion of atrial fibrillation    Tausha Milhoan A Margarit Minshall 05/04/2021, 9:53 AM

## 2021-05-04 NOTE — Anesthesia Preprocedure Evaluation (Addendum)
Anesthesia Evaluation  Patient identified by MRN, date of birth, ID band Patient awake    Reviewed: Allergy & Precautions, NPO status , Patient's Chart, lab work & pertinent test results  Airway Mallampati: I  TM Distance: >3 FB Neck ROM: Full    Dental no notable dental hx. (+) Dental Advisory Given, Teeth Intact, Caps   Pulmonary neg pulmonary ROS,    Pulmonary exam normal breath sounds clear to auscultation       Cardiovascular Normal cardiovascular exam+ dysrhythmias Atrial Fibrillation  Rhythm:Regular Rate:Normal  S/P ablation    Neuro/Psych negative neurological ROS  negative psych ROS   GI/Hepatic Neg liver ROS, GERD  Medicated and Controlled,  Endo/Other  Obesity   Renal/GU negative Renal ROS  negative genitourinary   Musculoskeletal  (+) Arthritis , Osteoarthritis,  Left middle trigger finger   Abdominal   Peds  Hematology negative hematology ROS (+)   Anesthesia Other Findings   Reproductive/Obstetrics                            Anesthesia Physical  Anesthesia Plan  ASA: 3  Anesthesia Plan: MAC   Post-op Pain Management:    Induction: Intravenous  PONV Risk Score and Plan: 1 and Treatment may vary due to age or medical condition and TIVA  Airway Management Planned: Natural Airway, Nasal Cannula and Simple Face Mask  Additional Equipment: None  Intra-op Plan:   Post-operative Plan:   Informed Consent: I have reviewed the patients History and Physical, chart, labs and discussed the procedure including the risks, benefits and alternatives for the proposed anesthesia with the patient or authorized representative who has indicated his/her understanding and acceptance.     Dental advisory given  Plan Discussed with: CRNA, Surgeon and Anesthesiologist  Anesthesia Plan Comments:        Anesthesia Quick Evaluation

## 2021-05-05 ENCOUNTER — Encounter (HOSPITAL_COMMUNITY): Payer: Self-pay | Admitting: Internal Medicine

## 2021-05-11 ENCOUNTER — Ambulatory Visit (HOSPITAL_COMMUNITY)
Admission: RE | Admit: 2021-05-11 | Discharge: 2021-05-11 | Disposition: A | Discharge status: Home / Self Care | Payer: Medicare Other | Source: Ambulatory Visit | Attending: Physician Assistant | Admitting: Physician Assistant

## 2021-05-11 ENCOUNTER — Encounter (HOSPITAL_COMMUNITY): Payer: Self-pay | Admitting: Physician Assistant

## 2021-05-11 ENCOUNTER — Other Ambulatory Visit: Payer: Self-pay

## 2021-05-11 VITALS — BP 110/70 | HR 123 | Ht 72.0 in | Wt 223.2 lb

## 2021-05-11 DIAGNOSIS — Z8616 Personal history of COVID-19: Secondary | ICD-10-CM | POA: Diagnosis not present

## 2021-05-11 DIAGNOSIS — Z7901 Long term (current) use of anticoagulants: Secondary | ICD-10-CM | POA: Insufficient documentation

## 2021-05-11 DIAGNOSIS — D6869 Other thrombophilia: Secondary | ICD-10-CM | POA: Diagnosis not present

## 2021-05-11 DIAGNOSIS — E669 Obesity, unspecified: Secondary | ICD-10-CM | POA: Insufficient documentation

## 2021-05-11 DIAGNOSIS — I4819 Other persistent atrial fibrillation: Secondary | ICD-10-CM | POA: Diagnosis not present

## 2021-05-11 DIAGNOSIS — Z6830 Body mass index (BMI) 30.0-30.9, adult: Secondary | ICD-10-CM | POA: Diagnosis not present

## 2021-05-11 DIAGNOSIS — Z7182 Exercise counseling: Secondary | ICD-10-CM | POA: Diagnosis not present

## 2021-05-11 MED ORDER — DILTIAZEM HCL ER COATED BEADS 240 MG PO CP24: 240.0000 mg | ORAL_CAPSULE | Freq: Every day | ORAL | 3 refills | Status: DC

## 2021-05-11 NOTE — Patient Instructions (Signed)
Increase cardizem to 240mg once a day   

## 2021-05-11 NOTE — Progress Notes (Signed)
Primary Care Physician: Maurice Small, MD (Inactive) Primary Cardiologist: Dr Bettina Gavia Primary Electrophysiologist: Dr Rayann Heman Referring Physician: Dr Lenise Herald is a 75 y.o. male with a history of atrial flutter and atrial fibrillation who presents for consultation in the [facility]. He was diagnosed with afib on routine physical in 2013.  He had atrial flutter as well.  He failed medical therapy with flecainide.  He underwent PVI and CTI ablations by Dr Rayann Heman 12/06/2011. His afib initially resolved. He developed recurrent afib 04/09/21 with symptoms of palpitations and fatigue.  He was evaluated by Dr Bettina Gavia and started on xarelto 04/12/21.  He reports compliance without interruption since then. He was diagnosed with COVID 04/14/21.  In retrospect, it could have been covid that caused his AF recurrence. Patient is on Xarelto for a CHADS2VASC score of 2.  On follow up today, patient is s/p DCCV on 05/04/21. Unfortunately, he has had early return of afib, about 2-3 days after DCCV. He does note rapid rates at home with some fatigue. He denies any bleeding issues on anticoagulation.   Today, he denies symptoms of chest pain, shortness of breath, orthopnea, PND, lower extremity edema, dizziness, presyncope, syncope, snoring, daytime somnolence, bleeding, or neurologic sequela. The patient is tolerating medications without difficulties and is otherwise without complaint today.    Atrial Fibrillation Risk Factors:  he does not have symptoms or diagnosis of sleep apnea. he does not have a history of rheumatic fever.   he has a BMI of Body mass index is 30.27 kg/m.Marland Kitchen Filed Weights   05/11/21 1319  Weight: 101.2 kg    Family History  Problem Relation Age of Onset   Alcoholism Father    Heart attack Father    Heart attack Mother    Atrial fibrillation Mother    Atrial fibrillation Brother      Atrial Fibrillation Management history:  Previous  antiarrhythmic drugs: flecainide Previous cardioversions: 2013, 05/04/21 Previous ablations: 2013 CHADS2VASC score: 2 Anticoagulation history: Xarelto    Past Medical History:  Diagnosis Date   Atrial fibrillation ([place])    Atrial flutter ([place]) 09/2011   DJD (degenerative joint disease)    Dysrhythmia    a-fib   GERD (gastroesophageal reflux disease)    Lumbar disc disease    Syncope 2012   negative workup   Past Surgical History:  Procedure Laterality Date   APPENDECTOMY     ATRIAL FIBRILLATION ABLATION N/A 12/06/2011   Procedure: ATRIAL FIBRILLATION ABLATION;  Surgeon: Thompson Grayer, MD;  Location: [facility] CATH LAB;  Service: Cardiovascular;  Laterality: N/A;   Atrial fibrillation and Atrial flutter ablation  12/06/2011   PVI and CTI ablation by Dr Norman Herrlich SURGERY     CARDIOVERSION  11/30/2011   Procedure: CARDIOVERSION;  Surgeon: Jacolyn Reedy, MD;  Location: [place];  Service: Cardiovascular;  Laterality: N/A;   CARDIOVERSION N/A 05/04/2021   Procedure: CARDIOVERSION;  Surgeon: Werner Lean, MD;  Location: [place] ENDOSCOPY;  Service: Cardiovascular;  Laterality: N/A;   ENDOVENOUS ABLATION SAPHENOUS VEIN W/ LASER Left 08/08/2017   endovenous laser ablation L greater saphenous vein by Tinnie Gens MD    [place] Right 08/21/2020   Dr. Laurance Flatten in [place]   stab phlebectomy Left 11/28/2017   stab phlebectomy 10-20 incisions left leg by Tinnie Gens MD    TEE WITHOUT  CARDIOVERSION  12/06/2011   Procedure: TRANSESOPHAGEAL ECHOCARDIOGRAM (TEE);  Surgeon: Jolaine Artist, MD;  Location: [facility] ENDOSCOPY;  Service: Cardiovascular;  Laterality: N/A;   TRIGGER FINGER RELEASE Left 07/12/2019   Procedure: RELEASE TRIGGER FINGER/A-1 PULLEY LEFT MIDDLE FINGER;  Surgeon: Daryll Brod, MD;  Location: [place];  Service: Orthopedics;  Laterality: Left;  IV REGIONAL FOREARM BLOCK     Current Outpatient Medications  Medication Sig Dispense Refill   diltiazem (CARDIZEM CD) 180 MG 24 hr capsule Take 1 capsule (180 mg total) by mouth daily. 90 capsule 3   omeprazole (PRILOSEC) 20 MG capsule Take 20 mg by mouth every other day.     rivaroxaban (XARELTO) 20 MG TABS tablet Take 1 tablet (20 mg total) by mouth daily with supper. 90 tablet 3   No current facility-administered medications for this encounter.    No Known Allergies  Social History   Socioeconomic History   Marital status: Married    Spouse name: Not on file   Number of children: Not on file   Years of education: Not on file   Highest education level: Not on file  Occupational History   Not on file  Tobacco Use   Smoking status: Never   Smokeless tobacco: Never  Vaping Use   Vaping Use: Never used  Substance and Sexual Activity   Alcohol use: Yes    Comment: couple of glasses of wine per week   Drug use: No   Sexual activity: Not on file  Other Topics Concern   Not on file  Social History Narrative   Pt lives in [place].  Consultant in Advertising copywriter.    Social Determinants of Health   Financial Resource Strain: Not on file  Food Insecurity: Not on file  Transportation Needs: Not on file  Physical Activity: Not on file  Stress: Not on file  Social Connections: Not on file  Intimate Partner Violence: Not on file     ROS- All systems are reviewed and negative except as per the HPI above.  Physical Exam: Vitals:   05/11/21 1319  BP: 110/70  Pulse: (!) 123  Weight: 101.2 kg  Height: 6' (1.829 m)    GEN- The patient is a well appearing obese elderly male, alert and oriented x 3 today.   Head- normocephalic, atraumatic Eyes-  Sclera clear, conjunctiva pink Ears- hearing intact Oropharynx- clear Neck- supple  Lungs- Clear to ausculation bilaterally, normal work of breathing Heart- irregular rate and rhythm, no murmurs, rubs or gallops  GI- soft, NT, ND, +  BS Extremities- no clubbing, cyanosis, or edema MS- no significant deformity or atrophy Skin- no rash or lesion Psych- euthymic mood, full affect Neuro- strength and sensation are intact  Wt Readings from Last 3 Encounters:  05/11/21 101.2 kg  04/26/21 101.1 kg  04/12/21 103.2 kg    EKG today demonstrates  Afib RVR Vent. rate 123 BPM PR interval * ms QRS duration 106 ms QT/QTcB 322/460 ms  Echo 09/22/11 demonstrated  - Left ventricle: The cavity size was normal. Systolic    function was normal. The estimated ejection fraction was    in the range of 60% to 65%. Wall motion was normal; there  were no regional wall motion abnormalities.  - Aortic valve: Trivial regurgitation.  - Mitral valve: Mild regurgitation.  - Left atrium: The atrium was moderately dilated.  - Right ventricle: The cavity size was mildly dilated. Wall    thickness was normal.  - Pulmonary  arteries: Systolic pressure was mildly    increased.   Epic records are reviewed at length today  CHA2DS2-VASc Score = 2  The patient's score is based upon: CHF History: 0 HTN History: 0 Diabetes History: 0 Stroke History: 0 Vascular Disease History: 0 Age Score: 2 Gender Score: 0      ASSESSMENT AND PLAN: 1. Persistent Atrial Fibrillation/atrial flutter The patient's CHA2DS2-VASc score is 2, indicating a 2.2% annual risk of stroke.   S/p DCCV on 05/04/21 with early return of afib. He is mostly unaware of his arrhythmia but dose have rapid rates. Will increase diltiazem to 240 mg daily Continue Xarelto 20 mg daily Patient is agreeable to repeat ablation with Dr Rayann Heman which they spoke about at his office visit 04/26/21. Will reach out to Dr Allred's office to arrange.  Suspect recurrence related to recent COVID infection.  Will need echo once better rate controlled or in SR.  2. Secondary Hypercoagulable State (ICD10:  D68.69) The patient is at significant risk for stroke/thromboembolism based upon his  CHA2DS2-VASc Score of 2.  Continue Rivaroxaban (Xarelto).   3. Obesity Body mass index is 30.27 kg/m. Lifestyle modification was discussed at length including regular exercise and weight reduction.    Follow up with Dr Rayann Heman, timing pending ablation schedule. Follow up in the AF clinic in 2 weeks to assess rate control.    [facility] [address] [place], [place] [address] [phone] 05/11/2021 1:39 PM

## 2021-05-24 ENCOUNTER — Ambulatory Visit (HOSPITAL_COMMUNITY)
Admission: RE | Admit: 2021-05-24 | Discharge: 2021-05-24 | Disposition: A | Discharge status: Home / Self Care | Payer: Medicare Other | Source: Ambulatory Visit | Attending: Physician Assistant | Admitting: Physician Assistant

## 2021-05-24 ENCOUNTER — Encounter (HOSPITAL_COMMUNITY): Payer: Self-pay | Admitting: Physician Assistant

## 2021-05-24 VITALS — BP 130/90 | HR 115 | Ht 72.0 in | Wt 224.6 lb

## 2021-05-24 DIAGNOSIS — I4892 Unspecified atrial flutter: Secondary | ICD-10-CM | POA: Insufficient documentation

## 2021-05-24 DIAGNOSIS — I4819 Other persistent atrial fibrillation: Secondary | ICD-10-CM | POA: Diagnosis not present

## 2021-05-24 DIAGNOSIS — Z6830 Body mass index (BMI) 30.0-30.9, adult: Secondary | ICD-10-CM | POA: Diagnosis not present

## 2021-05-24 DIAGNOSIS — Z7901 Long term (current) use of anticoagulants: Secondary | ICD-10-CM | POA: Diagnosis not present

## 2021-05-24 DIAGNOSIS — E669 Obesity, unspecified: Secondary | ICD-10-CM | POA: Diagnosis not present

## 2021-05-24 DIAGNOSIS — D6869 Other thrombophilia: Secondary | ICD-10-CM | POA: Insufficient documentation

## 2021-05-24 NOTE — Progress Notes (Signed)
Primary Care Physician: Maurice Small, MD (Inactive) Primary Cardiologist: Dr Bettina Gavia Primary Electrophysiologist: Dr Rayann Heman Referring Physician: Dr Lenise Herald is a 75 y.o. male with a history of atrial flutter and atrial fibrillation who presents for follow up in the [facility]. He was diagnosed with afib on routine physical in 2013.  He had atrial flutter as well.  He failed medical therapy with flecainide.  He underwent PVI and CTI ablations by Dr Rayann Heman 12/06/2011. His afib initially resolved. He developed recurrent afib 04/09/21 with symptoms of palpitations and fatigue.  He was evaluated by Dr Bettina Gavia and started on xarelto 04/12/21.  He reports compliance without interruption since then. He was diagnosed with COVID 04/14/21.  In retrospect, it could have been covid that caused his AF recurrence. Patient is on Xarelto for a CHADS2VASC score of 2. Patient is s/p DCCV on 05/04/21. Unfortunately, he had early return of afib, about 2-3 days after DCCV.   On follow up today, patient reports he is about the same as his last visit. He has not noticed a change in his palpitations or fatigue since increasing diltiazem. He denies any bleeding issues on anticoagulation.    Today, he denies symptoms of chest pain, shortness of breath, orthopnea, PND, lower extremity edema, dizziness, presyncope, syncope, snoring, daytime somnolence, bleeding, or neurologic sequela. The patient is tolerating medications without difficulties and is otherwise without complaint today.    Atrial Fibrillation Risk Factors:  he does not have symptoms or diagnosis of sleep apnea. he does not have a history of rheumatic fever.   he has a BMI of Body mass index is 30.46 kg/m.Marland Kitchen Filed Weights   05/24/21 1348  Weight: 101.9 kg     Family History  Problem Relation Age of Onset   Alcoholism Father    Heart attack Father    Heart attack Mother    Atrial fibrillation Mother     Atrial fibrillation Brother      Atrial Fibrillation Management history:  Previous antiarrhythmic drugs: flecainide Previous cardioversions: 2013, 05/04/21 Previous ablations: 2013 CHADS2VASC score: 2 Anticoagulation history: Xarelto    Past Medical History:  Diagnosis Date   Atrial fibrillation ([place])    Atrial flutter ([place]) 09/2011   DJD (degenerative joint disease)    Dysrhythmia    a-fib   GERD (gastroesophageal reflux disease)    Lumbar disc disease    Syncope 2012   negative workup   Past Surgical History:  Procedure Laterality Date   APPENDECTOMY     ATRIAL FIBRILLATION ABLATION N/A 12/06/2011   Procedure: ATRIAL FIBRILLATION ABLATION;  Surgeon: Thompson Grayer, MD;  Location: [facility] CATH LAB;  Service: Cardiovascular;  Laterality: N/A;   Atrial fibrillation and Atrial flutter ablation  12/06/2011   PVI and CTI ablation by Dr Norman Herrlich SURGERY     CARDIOVERSION  11/30/2011   Procedure: CARDIOVERSION;  Surgeon: Jacolyn Reedy, MD;  Location: [place];  Service: Cardiovascular;  Laterality: N/A;   CARDIOVERSION N/A 05/04/2021   Procedure: CARDIOVERSION;  Surgeon: Werner Lean, MD;  Location: [facility] [place] ENDOSCOPY;  Service: Cardiovascular;  Laterality: N/A;   ENDOVENOUS ABLATION SAPHENOUS VEIN W/ LASER Left 08/08/2017   endovenous laser ablation L greater saphenous vein by Tinnie Gens MD    [place] Right 08/21/2020   Dr. Laurance Flatten in [place]   stab phlebectomy Left  11/28/2017   stab phlebectomy 10-20 incisions left leg by Tinnie Gens MD    TEE WITHOUT CARDIOVERSION  12/06/2011   Procedure: TRANSESOPHAGEAL ECHOCARDIOGRAM (TEE);  Surgeon: Jolaine Artist, MD;  Location: [facility] ENDOSCOPY;  Service: Cardiovascular;  Laterality: N/A;   TRIGGER FINGER RELEASE Left 07/12/2019   Procedure: RELEASE TRIGGER FINGER/A-1 PULLEY LEFT MIDDLE FINGER;  Surgeon: Daryll Brod, MD;  Location: [facility];  Service: Orthopedics;  Laterality: Left;  IV REGIONAL FOREARM BLOCK    Current Outpatient Medications  Medication Sig Dispense Refill   diltiazem (CARDIZEM CD) 240 MG 24 hr capsule Take 1 capsule (240 mg total) by mouth daily. 30 capsule 3   omeprazole (PRILOSEC) 20 MG capsule Take 20 mg by mouth every other day.     rivaroxaban (XARELTO) 20 MG TABS tablet Take 1 tablet (20 mg total) by mouth daily with supper. 90 tablet 3   No current facility-administered medications for this encounter.    No Known Allergies  Social History   Socioeconomic History   Marital status: Married    Spouse name: Not on file   Number of children: Not on file   Years of education: Not on file   Highest education level: Not on file  Occupational History   Not on file  Tobacco Use   Smoking status: Never   Smokeless tobacco: Never  Vaping Use   Vaping Use: Never used  Substance and Sexual Activity   Alcohol use: Yes    Comment: couple of glasses of wine per week   Drug use: No   Sexual activity: Not on file  Other Topics Concern   Not on file  Social History Narrative   Pt lives in [place].  Consultant in Advertising copywriter.    Social Determinants of Health   Financial Resource Strain: Not on file  Food Insecurity: Not on file  Transportation Needs: Not on file  Physical Activity: Not on file  Stress: Not on file  Social Connections: Not on file  Intimate Partner Violence: Not on file     ROS- All systems are reviewed and negative except as per the HPI above.  Physical Exam: Vitals:   05/24/21 1348  BP: 130/90  Pulse: (!) 115  Weight: 101.9 kg  Height: 6' (1.829 m)    GEN- The patient is a well appearing elderly male, alert and oriented x 3 today.   HEENT-head normocephalic, atraumatic, sclera clear, conjunctiva pink, hearing intact, trachea midline. Lungs- Clear to ausculation bilaterally, normal work of breathing Heart- irregular rate and rhythm, no  murmurs, rubs or gallops  GI- soft, NT, ND, + BS Extremities- no clubbing, cyanosis, or edema MS- no significant deformity or atrophy Skin- no rash or lesion Psych- euthymic mood, full affect Neuro- strength and sensation are intact   Wt Readings from Last 3 Encounters:  05/24/21 101.9 kg  05/11/21 101.2 kg  04/26/21 101.1 kg    EKG today demonstrates  Afib  Vent. rate 115 BPM PR interval * ms QRS duration 110 ms QT/QTcB 336/464 ms  Echo 09/22/11 demonstrated  - Left ventricle: The cavity size was normal. Systolic    function was normal. The estimated ejection fraction was    in the range of 60% to 65%. Wall motion was normal; there  were no regional wall motion abnormalities.  - Aortic valve: Trivial regurgitation.  - Mitral valve: Mild regurgitation.  - Left atrium: The atrium was moderately dilated.  - Right ventricle: The cavity size was  mildly dilated. Wall    thickness was normal.  - Pulmonary arteries: Systolic pressure was mildly    increased.   Epic records are reviewed at length today  CHA2DS2-VASc Score = 2  The patient's score is based upon: CHF History: 0 HTN History: 0 Diabetes History: 0 Stroke History: 0 Vascular Disease History: 0 Age Score: 2 Gender Score: 0      ASSESSMENT AND PLAN: 1. Persistent Atrial Fibrillation/atrial flutter The patient's CHA2DS2-VASc score is 2, indicating a 2.2% annual risk of stroke.   S/p ablation 2013. S/p DCCV on 05/04/21 with early return of afib. Patient remains in symptomatic afib. We discussed rhythm control options including AAD (flecainide, dofetilide, amiodarone) with repeat DCCV as possible bridge to repeat ablation. Patient declines all of these today stating he does not like to take medication and "cardioversions have never worked for me." He wants to discuss repeat ablation. Will refer to new EP given Dr Jackalyn Lombard imminent departure and full ablation schedule.  Continue diltiazem 240 mg daily Continue Xarelto  20 mg daily  2. Secondary Hypercoagulable State (ICD10:  D68.69) The patient is at significant risk for stroke/thromboembolism based upon his CHA2DS2-VASc Score of 2.  Continue Rivaroxaban (Xarelto).   3. Obesity Body mass index is 30.46 kg/m. Lifestyle modification was discussed and encouraged including regular physical activity and weight reduction.   Follow up with EP to establish care and for ablation evaluation.    [facility] [address] [place], [place] [address] [phone] 05/24/2021 2:02 PM

## 2021-06-09 NOTE — Progress Notes (Signed)
Electrophysiology Office Follow up Visit Note:    Date:  06/10/2021   ID:  Wyatt Duran, DOB Nov 01, 1945, MRN [id number]  PCP:  Maurice Small, MD (Inactive)   [facility] Electrophysiologist:  Vickie Epley, MD    Interval History:    Wyatt Duran is a 75 y.o. male who presents for a follow up visit.  He is a previous patient of Dr. Rayann Heman.  He was seen by Adline Peals in the A. fib clinic on May 24, 2021.  Patient has a history of atrial fibrillation flutter and underwent a PVI/CTI ablation by Dr. Rayann Heman December 06, 2011.  He developed recurrence of his arrhythmia in October 2022 with palpitations and fatigue.  He is on Xarelto for stroke prophylaxis.  And when a cardioversion on May 04, 2021 but had recurrence of atrial fibrillation 2 to 3 days after his cardioversion.   He is symptomatic when he is in atrial fibrillation with fatigue and decreased exercise tolerance.  He is very active.  He is always working outside and walking.        Past Medical History:  Diagnosis Date   Atrial fibrillation [facility])    Atrial flutter ([place]) 09/2011   DJD (degenerative joint disease)    Dysrhythmia    a-fib   GERD (gastroesophageal reflux disease)    Lumbar disc disease    Syncope 2012   negative workup    Past Surgical History:  Procedure Laterality Date   APPENDECTOMY     ATRIAL FIBRILLATION ABLATION N/A 12/06/2011   Procedure: ATRIAL FIBRILLATION ABLATION;  Surgeon: Thompson Grayer, MD;  Location: [facility] CATH LAB;  Service: Cardiovascular;  Laterality: N/A;   Atrial fibrillation and Atrial flutter ablation  12/06/2011   PVI and CTI ablation by Dr Norman Herrlich SURGERY     CARDIOVERSION  11/30/2011   Procedure: CARDIOVERSION;  Surgeon: Jacolyn Reedy, MD;  Location: [place];  Service: Cardiovascular;  Laterality: N/A;   CARDIOVERSION N/A 05/04/2021   Procedure: CARDIOVERSION;  Surgeon: Werner Lean, MD;  Location: [place] ENDOSCOPY;  Service: Cardiovascular;   Laterality: N/A;   ENDOVENOUS ABLATION SAPHENOUS VEIN W/ LASER Left 08/08/2017   endovenous laser ablation L greater saphenous vein by Tinnie Gens MD    [place] Right 08/21/2020   Dr. Laurance Flatten in [place]   stab phlebectomy Left 11/28/2017   stab phlebectomy 10-20 incisions left leg by Tinnie Gens MD    TEE WITHOUT CARDIOVERSION  12/06/2011   Procedure: TRANSESOPHAGEAL ECHOCARDIOGRAM (TEE);  Surgeon: Jolaine Artist, MD;  Location: [facility] ENDOSCOPY;  Service: Cardiovascular;  Laterality: N/A;   TRIGGER FINGER RELEASE Left 07/12/2019   Procedure: RELEASE TRIGGER FINGER/A-1 PULLEY LEFT MIDDLE FINGER;  Surgeon: Daryll Brod, MD;  Location: [place];  Service: Orthopedics;  Laterality: Left;  IV REGIONAL FOREARM BLOCK    Current Medications: Current Meds  Medication Sig   diltiazem (CARDIZEM CD) 240 MG 24 hr capsule Take 1 capsule (240 mg total) by mouth daily.   omeprazole (PRILOSEC) 20 MG capsule Take 20 mg by mouth every other day.   rivaroxaban (XARELTO) 20 MG TABS tablet Take 1 tablet (20 mg total) by mouth daily with supper.     Allergies:   Patient has no known allergies.   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    Social Determinants of Health   Financial Resource Strain: Not on file  Food  Insecurity: Not on file  Transportation Needs: Not on file  Physical Activity: Not on file  Stress: Not on file  Social Connections: Not on file     Family History: The patient's family history includes Alcoholism in his father; Atrial fibrillation in his brother and mother; Heart attack in his father and mother.  ROS:   Please see the history of present illness.    All other systems reviewed and are negative.  EKGs/Labs/Other Studies Reviewed:    The following studies were reviewed today:  Prior records  2013 echo showed normal LV function  EKG:  The ekg ordered today demonstrates atrial fibrillation with a ventricular rate of 91 bpm.  PVC.  Recent Labs: 04/12/2021: Platelets 230 05/04/2021: BUN 23; Creatinine, Ser 1.00; Hemoglobin 15.6; Potassium 4.3; Sodium 140  Recent Lipid Panel No results found for: CHOL, TRIG, HDL, CHOLHDL, VLDL, LDLCALC, LDLDIRECT  Physical Exam:    VS:  BP 128/70   Pulse 91   Ht 6' (1.829 m)   Wt 224 lb 12.8 oz (102 kg)   SpO2 99%   BMI 30.49 kg/m     Wt Readings from Last 3 Encounters:  06/10/21 224 lb 12.8 oz (102 kg)  05/24/21 224 lb 9.6 oz (101.9 kg)  05/11/21 223 lb 3.2 oz (101.2 kg)     GEN: Well nourished, well developed in no acute distress.  Appears younger than stated age 41: Normal NECK: No JVD; No carotid bruits LYMPHATICS: No lymphadenopathy CARDIAC: Irregularly irregular, no murmurs, rubs, gallops RESPIRATORY:  Clear to auscultation without rales, wheezing or rhonchi  ABDOMEN: Soft, non-tender, non-distended MUSCULOSKELETAL:  No edema; No deformity  SKIN: Warm and dry NEUROLOGIC:  Alert and oriented x 3 PSYCHIATRIC:  Normal affect        ASSESSMENT:    1. Persistent atrial fibrillation ([place])   2. Atrial flutter    PLAN:    In order of problems listed above:  #Persistent atrial fibrillation Symptomatic.  Did well for many years after his first ablation 9-1/2 years ago.  Has since had a recurrence of  symptomatic A. fib.  He would like to avoid long-term use of medications if at all possible.  I discussed catheter ablation in detail during today's visit include the risks recovery and likelihood of success and he wishes to proceed.  He will need an echo and a CT pulmonary vein protocol prior to the procedure.   Risk, benefits, and alternatives to EP study and radiofrequency ablation for afib were also discussed in detail today. These risks include but are not limited to stroke, bleeding, vascular damage, tamponade, perforation, damage to the esophagus, lungs, and other structures, pulmonary vein stenosis, worsening renal function, and death. The patient understands these risk and wishes to  proceed.  We will therefore proceed with catheter ablation at the next available time.  Carto, ICE, anesthesia are requested for the procedure.  Will also obtain CT PV protocol prior to the procedure to exclude LAA thrombus and further evaluate atrial anatomy.  I will plan to check the pulmonary veins, complete the posterior wall and check the CTI.  We will also check for non-PV triggers using Isopril during the procedure.          Total time spent with patient today 45 minutes. This includes reviewing records, evaluating the patient and coordinating care.   Medication Adjustments/Labs and Tests Ordered: Current medicines are reviewed at length with the patient today.  Concerns regarding medicines are outlined above.  No orders of the defined types were placed in this encounter.  No orders of the defined types were placed in this encounter.    Signed, Lars Mage, MD, [facility], [facility] 06/10/2021 11:35 AM    Electrophysiology [facility]

## 2021-06-10 ENCOUNTER — Encounter: Payer: Self-pay | Admitting: Cardiology

## 2021-06-10 ENCOUNTER — Encounter: Payer: Self-pay | Admitting: *Deleted

## 2021-06-10 ENCOUNTER — Ambulatory Visit (INDEPENDENT_AMBULATORY_CARE_PROVIDER_SITE_OTHER): Payer: Medicare Other | Admitting: Cardiology

## 2021-06-10 ENCOUNTER — Other Ambulatory Visit: Payer: Self-pay

## 2021-06-10 VITALS — BP 128/70 | HR 91 | Ht 72.0 in | Wt 224.8 lb

## 2021-06-10 DIAGNOSIS — I480 Paroxysmal atrial fibrillation: Secondary | ICD-10-CM

## 2021-06-10 DIAGNOSIS — I4892 Unspecified atrial flutter: Secondary | ICD-10-CM

## 2021-06-10 DIAGNOSIS — I4819 Other persistent atrial fibrillation: Secondary | ICD-10-CM

## 2021-06-10 MED ORDER — METOPROLOL TARTRATE 50 MG PO TABS: 50.0000 mg | ORAL_TABLET | Freq: Once | ORAL | 0 refills | Status: DC | PRN

## 2021-06-10 NOTE — Patient Instructions (Addendum)
Medication Instructions:  Your physician recommends that you continue on your current medications as directed. Please refer to the Current Medication list given to you today. *If you need a refill on your cardiac medications before your next appointment, please call your pharmacy*  Lab Work: None. If you have labs (blood work) drawn today and your tests are completely normal, you will receive your results only by: [facility] (if you have MyChart) OR A paper copy in the mail If you have any lab test that is abnormal or we need to change your treatment, we will call you to review the results.  Testing/Procedures: Your physician has requested that you have an echocardiogram. Echocardiography is a painless test that uses sound waves to create images of your heart. It provides your doctor with information about the size and shape of your heart and how well your heart's chambers and valves are working. This procedure takes approximately one hour. There are no restrictions for this procedure.  Your physician has requested that you have cardiac CT. Cardiac computed tomography (CT) is a painless test that uses an x-ray machine to take clear, detailed pictures of your heart. For further information please visit [email or web address]. Please follow instruction sheet as given.   Follow-Up: At [facility], you and your health needs are our priority.  As part of our continuing mission to provide you with exceptional heart care, we have created designated Provider Care Teams.  These Care Teams include your primary Cardiologist (physician) and Advanced Practice Providers (APPs -  Physician Assistants and Nurse Practitioners) who all work together to provide you with the care you need, when you need it.  We recommend signing up for the patient portal called "MyChart".  Sign up information is provided on this After Visit Summary.  MyChart is used to connect with patients for Virtual Visits (Telemedicine).   Patients are able to view lab/test results, encounter notes, upcoming appointments, etc.  Non-urgent messages can be sent to your provider as well.   To learn more about what you can do with MyChart, go to [email or web address].    Any Other Special Instructions Will Be Listed Below (If Applicable).   Cardiac Ablation Cardiac ablation is a procedure to destroy (ablate) some heart tissue that is sending bad signals. These bad signals cause problems in heart rhythm. The heart has many areas that make these signals. If there are problems in these areas, they can make the heart beat in a way that is not normal. Destroying some tissues can help make the heart rhythm normal. Tell your doctor about: Any allergies you have. All medicines you are taking. These include vitamins, herbs, eye drops, creams, and over-the-counter medicines. Any problems you or family members have had with medicines that make you fall asleep (anesthetics). Any blood disorders you have. Any surgeries you have had. Any medical conditions you have, such as kidney failure. Whether you are pregnant or may be pregnant. What are the risks? This is a safe procedure. But problems may occur, including: Infection. Bruising and bleeding. Bleeding into the chest. Stroke or blood clots. Damage to nearby areas of your body. Allergies to medicines or dyes. The need for a pacemaker if the normal system is damaged. Failure of the procedure to treat the problem. What happens before the procedure? Medicines Ask your doctor about: Changing or stopping your normal medicines. This is important. Taking aspirin and ibuprofen. Do not take these medicines unless your doctor tells you to take them. Taking other medicines, vitamins,  herbs, and supplements. General instructions Follow instructions from your doctor about what you cannot eat or drink. Plan to have someone take you home from the hospital or clinic. If you will be going home  right after the procedure, plan to have someone with you for 24 hours. Ask your doctor what steps will be taken to prevent infection. What happens during the procedure?  An IV tube will be put into one of your veins. You will be given a medicine to help you relax. The skin on your neck or groin will be numbed. A cut (incision) will be made in your neck or groin. A needle will be put through your cut and into a large vein. A tube (catheter) will be put into the needle. The tube will be moved to your heart. Dye may be put through the tube. This helps your doctor see your heart. Small devices (electrodes) on the tube will send out signals. A type of energy will be used to destroy some heart tissue. The tube will be taken out. Pressure will be held on your cut. This helps stop bleeding. A bandage will be put over your cut. The exact procedure may vary among doctors and hospitals. What happens after the procedure? You will be watched until you leave the hospital or clinic. This includes checking your heart rate, breathing rate, oxygen, and blood pressure. Your cut will be watched for bleeding. You will need to lie still for a few hours. Do not drive for 24 hours or as long as your doctor tells you. Summary Cardiac ablation is a procedure to destroy some heart tissue. This is done to treat heart rhythm problems. Tell your doctor about any medical conditions you may have. Tell him or her about all medicines you are taking to treat them. This is a safe procedure. But problems may occur. These include infection, bruising, bleeding, and damage to nearby areas of your body. Follow what your doctor tells you about food and drink. You may also be told to change or stop some of your medicines. After the procedure, do not drive for 24 hours or as long as your doctor tells you. This information is not intended to replace advice given to you by your health care provider. Make sure you discuss any questions  you have with your health care provider. Document Revised: 05/23/2019 Document Reviewed: 05/23/2019 Elsevier Patient Education  2022 [employer].

## 2021-06-10 NOTE — Addendum Note (Signed)
Addended by: Darrell Jewel on: 06/10/2021 12:18 PM   Modules accepted: Orders

## 2021-06-21 ENCOUNTER — Ambulatory Visit: Payer: Medicare Other | Admitting: Internal Medicine

## 2021-06-25 ENCOUNTER — Ambulatory Visit (HOSPITAL_COMMUNITY): Payer: Medicare Other | Attending: Cardiology

## 2021-06-25 ENCOUNTER — Other Ambulatory Visit: Payer: Self-pay

## 2021-06-25 DIAGNOSIS — I4819 Other persistent atrial fibrillation: Secondary | ICD-10-CM

## 2021-06-25 DIAGNOSIS — I480 Paroxysmal atrial fibrillation: Secondary | ICD-10-CM

## 2021-06-25 DIAGNOSIS — I4892 Unspecified atrial flutter: Secondary | ICD-10-CM | POA: Diagnosis present

## 2021-06-25 LAB — ECHOCARDIOGRAM COMPLETE
P 1/2 time: 1179 msec
S' Lateral: 4.3 cm

## 2021-08-31 ENCOUNTER — Other Ambulatory Visit: Payer: Self-pay

## 2021-08-31 ENCOUNTER — Other Ambulatory Visit: Payer: Medicare Other | Admitting: *Deleted

## 2021-08-31 DIAGNOSIS — I480 Paroxysmal atrial fibrillation: Secondary | ICD-10-CM

## 2021-08-31 DIAGNOSIS — I4819 Other persistent atrial fibrillation: Secondary | ICD-10-CM

## 2021-08-31 DIAGNOSIS — I4892 Unspecified atrial flutter: Secondary | ICD-10-CM

## 2021-08-31 LAB — CBC WITH DIFFERENTIAL/PLATELET
Basophils Absolute: 0.1 10*3/uL (ref 0.0–0.2)
Basos: 1 %
EOS (ABSOLUTE): 0.2 10*3/uL (ref 0.0–0.4)
Eos: 2 %
Hematocrit: 44.9 % (ref 37.5–51.0)
Hemoglobin: 14.9 g/dL (ref 13.0–17.7)
Immature Grans (Abs): 0 10*3/uL (ref 0.0–0.1)
Immature Granulocytes: 1 %
Lymphocytes Absolute: 1.3 10*3/uL (ref 0.7–3.1)
Lymphs: 21 %
MCH: 29.7 pg (ref 26.6–33.0)
MCHC: 33.2 g/dL (ref 31.5–35.7)
MCV: 90 fL (ref 79–97)
Monocytes Absolute: 0.5 10*3/uL (ref 0.1–0.9)
Monocytes: 8 %
Neutrophils Absolute: 4.4 10*3/uL (ref 1.4–7.0)
Neutrophils: 67 %
Platelets: 229 10*3/uL (ref 150–450)
RBC: 5.01 x10E6/uL (ref 4.14–5.80)
RDW: 13.7 % (ref 11.6–15.4)
WBC: 6.5 10*3/uL (ref 3.4–10.8)

## 2021-08-31 LAB — BASIC METABOLIC PANEL
BUN/Creatinine Ratio: 15 (ref 10–24)
BUN: 16 mg/dL (ref 8–27)
CO2: 26 mmol/L (ref 20–29)
Calcium: 9.1 mg/dL (ref 8.6–10.2)
Chloride: 106 mmol/L (ref 96–106)
Creatinine, Ser: 1.09 mg/dL (ref 0.76–1.27)
Glucose: 99 mg/dL (ref 70–99)
Potassium: 4.1 mmol/L (ref 3.5–5.2)
Sodium: 144 mmol/L (ref 134–144)
eGFR: 70 mL/min/{1.73_m2} (ref >59)

## 2021-09-05 ENCOUNTER — Other Ambulatory Visit (HOSPITAL_COMMUNITY): Payer: Self-pay | Admitting: Physician Assistant

## 2021-09-10 ENCOUNTER — Telehealth (HOSPITAL_COMMUNITY): Payer: Self-pay | Admitting: *Deleted

## 2021-09-10 NOTE — Telephone Encounter (Signed)
Attempted to call patient regarding upcoming cardiac CT appointment. °Left message on voicemail with name and callback number ° °Vearl Allbaugh RN Navigator Cardiac Imaging °[facility] °[phone] Office °[phone] Cell ° °

## 2021-09-13 ENCOUNTER — Other Ambulatory Visit: Payer: Self-pay

## 2021-09-13 ENCOUNTER — Ambulatory Visit (HOSPITAL_COMMUNITY)
Admission: RE | Admit: 2021-09-13 | Discharge: 2021-09-13 | Disposition: A | Discharge status: Home / Self Care | Payer: Medicare Other | Source: Ambulatory Visit | Attending: Cardiology | Admitting: Cardiology

## 2021-09-13 DIAGNOSIS — I480 Paroxysmal atrial fibrillation: Secondary | ICD-10-CM | POA: Diagnosis present

## 2021-09-13 MED ORDER — METOPROLOL TARTRATE 5 MG/5ML IV SOLN
INTRAVENOUS | Status: AC
  Filled 2021-09-13: qty 10

## 2021-09-13 MED ORDER — IOHEXOL 350 MG/ML SOLN
100.0000 mL | Freq: Once | INTRAVENOUS | Status: AC | PRN
  Administered 2021-09-13: 80 mL via INTRAVENOUS

## 2021-09-14 ENCOUNTER — Encounter: Payer: Self-pay | Admitting: Cardiology

## 2021-09-14 ENCOUNTER — Telehealth: Payer: Self-pay | Admitting: Cardiology

## 2021-09-14 NOTE — Telephone Encounter (Signed)
Patient is calling requesting a callback to discuss his upcoming ablation. He is going out of town up until the day before the procedure so he would like to discuss this today if possible at the latest tomorrow morning.  ?

## 2021-09-16 NOTE — Telephone Encounter (Signed)
See phone call 

## 2021-09-16 NOTE — Telephone Encounter (Signed)
Instructed patient how to view his instruction letters in [place] as he has misplaced them.  ?Verbalized understanding.  ?

## 2021-09-17 NOTE — Pre-Procedure Instructions (Signed)
Instructed patient on the following items: Arrival time 0530 Nothing to eat or drink after midnight No meds AM of procedure Responsible person to drive you home and stay with you for 24 hrs  Have you missed any doses of anti-coagulant Xarelto- hasn't missed any doses    

## 2021-09-20 ENCOUNTER — Ambulatory Visit (HOSPITAL_COMMUNITY)
Admission: RE | Admit: 2021-09-20 | Discharge: 2021-09-20 | Disposition: A | Discharge status: Home / Self Care | Payer: Medicare Other | Attending: Cardiology | Admitting: Cardiology

## 2021-09-20 ENCOUNTER — Encounter (HOSPITAL_COMMUNITY)
Admission: RE | Disposition: A | Discharge status: Home / Self Care | Payer: Self-pay | Source: Home / Self Care | Attending: Cardiology

## 2021-09-20 ENCOUNTER — Ambulatory Visit (HOSPITAL_COMMUNITY): Payer: Medicare Other | Admitting: Certified Registered Nurse Anesthetist

## 2021-09-20 ENCOUNTER — Other Ambulatory Visit: Payer: Self-pay

## 2021-09-20 ENCOUNTER — Encounter (HOSPITAL_COMMUNITY): Payer: Self-pay | Admitting: Cardiology

## 2021-09-20 ENCOUNTER — Ambulatory Visit (HOSPITAL_BASED_OUTPATIENT_CLINIC_OR_DEPARTMENT_OTHER): Payer: Medicare Other | Admitting: Certified Registered Nurse Anesthetist

## 2021-09-20 DIAGNOSIS — M1990 Unspecified osteoarthritis, unspecified site: Secondary | ICD-10-CM | POA: Diagnosis not present

## 2021-09-20 DIAGNOSIS — I4892 Unspecified atrial flutter: Secondary | ICD-10-CM | POA: Diagnosis not present

## 2021-09-20 DIAGNOSIS — Z7901 Long term (current) use of anticoagulants: Secondary | ICD-10-CM | POA: Insufficient documentation

## 2021-09-20 DIAGNOSIS — Z79899 Other long term (current) drug therapy: Secondary | ICD-10-CM | POA: Diagnosis not present

## 2021-09-20 DIAGNOSIS — K219 Gastro-esophageal reflux disease without esophagitis: Secondary | ICD-10-CM | POA: Insufficient documentation

## 2021-09-20 DIAGNOSIS — I4819 Other persistent atrial fibrillation: Secondary | ICD-10-CM

## 2021-09-20 DIAGNOSIS — I459 Conduction disorder, unspecified: Secondary | ICD-10-CM | POA: Diagnosis not present

## 2021-09-20 DIAGNOSIS — I517 Cardiomegaly: Secondary | ICD-10-CM | POA: Diagnosis not present

## 2021-09-20 HISTORY — PX: ATRIAL FIBRILLATION ABLATION: EP1191

## 2021-09-20 LAB — POCT ACTIVATED CLOTTING TIME
Activated Clotting Time: 335 seconds
Activated Clotting Time: 335 seconds

## 2021-09-20 SURGERY — ATRIAL FIBRILLATION ABLATION
Anesthesia: General

## 2021-09-20 MED ORDER — ONDANSETRON HCL 4 MG/2ML IJ SOLN: 4.0000 mg | Freq: Four times a day (QID) | INTRAMUSCULAR | Status: DC | PRN

## 2021-09-20 MED ORDER — COLCHICINE 0.6 MG PO TABS
0.6000 mg | ORAL_TABLET | Freq: Two times a day (BID) | ORAL | Status: DC
  Administered 2021-09-20: 0.6 mg via ORAL
  Filled 2021-09-20: qty 1

## 2021-09-20 MED ORDER — DEXAMETHASONE SODIUM PHOSPHATE 10 MG/ML IJ SOLN
INTRAMUSCULAR | Status: DC | PRN
  Administered 2021-09-20: 10 mg via INTRAVENOUS

## 2021-09-20 MED ORDER — ROCURONIUM BROMIDE 10 MG/ML (PF) SYRINGE
PREFILLED_SYRINGE | INTRAVENOUS | Status: DC | PRN
  Administered 2021-09-20: 100 mg via INTRAVENOUS

## 2021-09-20 MED ORDER — PHENYLEPHRINE HCL-NACL 20-0.9 MG/250ML-% IV SOLN
INTRAVENOUS | Status: DC | PRN
  Administered 2021-09-20: 25 ug/min via INTRAVENOUS

## 2021-09-20 MED ORDER — ACETAMINOPHEN 325 MG PO TABS
650.0000 mg | ORAL_TABLET | ORAL | Status: DC | PRN
  Filled 2021-09-20: qty 2

## 2021-09-20 MED ORDER — COLCHICINE 0.6 MG PO TABS: 0.6000 mg | ORAL_TABLET | Freq: Two times a day (BID) | ORAL | 0 refills | Status: DC

## 2021-09-20 MED ORDER — HEPARIN SODIUM (PORCINE) 1000 UNIT/ML IJ SOLN
INTRAMUSCULAR | Status: DC | PRN
  Administered 2021-09-20: 1000 [IU] via INTRAVENOUS

## 2021-09-20 MED ORDER — ISOPROTERENOL HCL 0.2 MG/ML IJ SOLN
INTRAMUSCULAR | Status: AC
  Filled 2021-09-20: qty 5

## 2021-09-20 MED ORDER — FENTANYL CITRATE (PF) 250 MCG/5ML IJ SOLN
INTRAMUSCULAR | Status: DC | PRN
  Administered 2021-09-20: 100 ug via INTRAVENOUS

## 2021-09-20 MED ORDER — LIDOCAINE 2% (20 MG/ML) 5 ML SYRINGE
INTRAMUSCULAR | Status: DC | PRN
  Administered 2021-09-20: 100 mg via INTRAVENOUS

## 2021-09-20 MED ORDER — SUGAMMADEX SODIUM 200 MG/2ML IV SOLN
INTRAVENOUS | Status: DC | PRN
  Administered 2021-09-20 (×2): 200 mg via INTRAVENOUS

## 2021-09-20 MED ORDER — HEPARIN SODIUM (PORCINE) 1000 UNIT/ML IJ SOLN
INTRAMUSCULAR | Status: AC
  Filled 2021-09-20: qty 10

## 2021-09-20 MED ORDER — PANTOPRAZOLE SODIUM 40 MG PO TBEC
40.0000 mg | DELAYED_RELEASE_TABLET | Freq: Every day | ORAL | Status: DC
Start: 2021-09-20 — End: 2021-09-20
  Administered 2021-09-20: 40 mg via ORAL
  Filled 2021-09-20: qty 1

## 2021-09-20 MED ORDER — SODIUM CHLORIDE 0.9% FLUSH: 3.0000 mL | Freq: Two times a day (BID) | INTRAVENOUS | Status: DC

## 2021-09-20 MED ORDER — HEPARIN SODIUM (PORCINE) 1000 UNIT/ML IJ SOLN
INTRAMUSCULAR | Status: DC | PRN
  Administered 2021-09-20: 5000 [IU] via INTRAVENOUS
  Administered 2021-09-20: 3000 [IU] via INTRAVENOUS
  Administered 2021-09-20: 16000 [IU] via INTRAVENOUS

## 2021-09-20 MED ORDER — PROPOFOL 10 MG/ML IV BOLUS
INTRAVENOUS | Status: DC | PRN
  Administered 2021-09-20: 150 mg via INTRAVENOUS

## 2021-09-20 MED ORDER — PROTAMINE SULFATE 10 MG/ML IV SOLN
INTRAVENOUS | Status: DC | PRN
  Administered 2021-09-20: 30 mg via INTRAVENOUS

## 2021-09-20 MED ORDER — SODIUM CHLORIDE 0.9 % IV SOLN: INTRAVENOUS | Status: DC

## 2021-09-20 MED ORDER — ISOPROTERENOL HCL 0.2 MG/ML IJ SOLN
INTRAVENOUS | Status: DC | PRN
  Administered 2021-09-20: 4 ug/min via INTRAVENOUS

## 2021-09-20 MED ORDER — ONDANSETRON HCL 4 MG/2ML IJ SOLN
INTRAMUSCULAR | Status: DC | PRN
  Administered 2021-09-20: 4 mg via INTRAVENOUS

## 2021-09-20 MED ORDER — HEPARIN (PORCINE) IN NACL 1000-0.9 UT/500ML-% IV SOLN
INTRAVENOUS | Status: AC
  Filled 2021-09-20: qty 1500

## 2021-09-20 MED ORDER — MIDAZOLAM HCL 2 MG/2ML IJ SOLN
INTRAMUSCULAR | Status: DC | PRN
  Administered 2021-09-20 (×2): 1 mg via INTRAVENOUS

## 2021-09-20 MED ORDER — SODIUM CHLORIDE 0.9% FLUSH: 3.0000 mL | INTRAVENOUS | Status: DC | PRN

## 2021-09-20 MED ORDER — HEPARIN (PORCINE) IN NACL 1000-0.9 UT/500ML-% IV SOLN
INTRAVENOUS | Status: DC | PRN
  Administered 2021-09-20 (×4): 500 mL

## 2021-09-20 MED ORDER — SODIUM CHLORIDE 0.9 % IV SOLN: 250.0000 mL | INTRAVENOUS | Status: DC | PRN

## 2021-09-20 SURGICAL SUPPLY — 18 items
BLANKET WARM UNDERBOD FULL ACC (MISCELLANEOUS) ×2 IMPLANT
CATH 8FR REPROCESSED SOUNDSTAR (CATHETERS) ×2 IMPLANT
CATH 8FR SOUNDSTAR REPROCESSED (CATHETERS) IMPLANT
CATH OCTARAY 2.0 F 3-3-3-3-3 (CATHETERS) ×1 IMPLANT
CATH S CIRCA THERM PROBE 10F (CATHETERS) ×1 IMPLANT
CATH SMTCH THERMOCOOL SF DF (CATHETERS) ×1 IMPLANT
CATH WEBSTER BI DIR CS D-F CRV (CATHETERS) ×1 IMPLANT
CLOSURE PERCLOSE PROSTYLE (VASCULAR PRODUCTS) ×3 IMPLANT
COVER SWIFTLINK CONNECTOR (BAG) ×2 IMPLANT
PACK EP LATEX FREE (CUSTOM PROCEDURE TRAY) ×2
PACK EP LF (CUSTOM PROCEDURE TRAY) ×1 IMPLANT
PAD DEFIB RADIO PHYSIO CONN (PAD) ×2 IMPLANT
PATCH CARTO3 (PAD) ×1 IMPLANT
SHEATH BAYLIS TRANSSEPTAL 98CM (NEEDLE) ×1 IMPLANT
SHEATH CARTO VIZIGO SM CVD (SHEATH) ×1 IMPLANT
SHEATH PINNACLE 8F 10CM (SHEATH) ×2 IMPLANT
SHEATH PINNACLE 9F 10CM (SHEATH) ×1 IMPLANT
TUBING SMART ABLATE COOLFLOW (TUBING) ×1 IMPLANT

## 2021-09-20 NOTE — Progress Notes (Signed)
Patient and wife was given discharge instructions. Both verbalized understanding. 

## 2021-09-20 NOTE — Anesthesia Procedure Notes (Signed)
Procedure Name: Intubation ?Date/Time: 09/20/2021 7:47 AM ?Performed by: Betha Loa, CRNA ?Pre-anesthesia Checklist: Patient identified, Emergency Drugs available, Suction available and Patient being monitored ?Patient Re-evaluated:Patient Re-evaluated prior to induction ?Oxygen Delivery Method: Circle System Utilized ?Preoxygenation: Pre-oxygenation with 100% oxygen ?Induction Type: IV induction ?Ventilation: Mask ventilation without difficulty and Oral airway inserted - appropriate to patient size ?Laryngoscope Size: 4 ?Grade View: Grade I ?Tube type: Oral ?Tube size: 7.5 mm ?Number of attempts: 1 ?Airway Equipment and Method: Stylet and Oral airway ?Placement Confirmation: ETT inserted through vocal cords under direct vision, positive ETCO2 and breath sounds checked- equal and bilateral ?Secured at: 22 cm ?Tube secured with: Tape ?Dental Injury: Teeth and Oropharynx as per pre-operative assessment  ? ? ? ? ?

## 2021-09-20 NOTE — Anesthesia Postprocedure Evaluation (Signed)
Anesthesia Post Note ? ?Patient: Wyatt Duran ? ?Procedure(s) Performed: ATRIAL FIBRILLATION ABLATION ? ?  ? ?Patient location during evaluation: PACU ?Anesthesia Type: General ?Level of consciousness: awake and alert ?Pain management: pain level controlled ?Vital Signs Assessment: post-procedure vital signs reviewed and stable ?Respiratory status: spontaneous breathing, nonlabored ventilation and respiratory function stable ?Cardiovascular status: blood pressure returned to baseline and stable ?Postop Assessment: no apparent nausea or vomiting ?Anesthetic complications: no ? ? ?No notable events documented. ? ?Last Vitals:  ?Vitals:  ? 09/20/21 1100 09/20/21 1115  ?BP: 127/88 (!) 127/94  ?Pulse: 73 71  ?Resp: 11 20  ?Temp:    ?SpO2: 98% 97%  ?  ?Last Pain:  ?Vitals:  ? 09/20/21 0608  ?TempSrc:   ?PainSc: 0-No pain  ? ? ?  ?  ?  ?  ?  ?  ? ?Lynda Rainwater ? ? ? ? ?

## 2021-09-20 NOTE — H&P (Signed)
?Electrophysiology Office Follow up Visit Note:   ?  ?Date:  09/20/2021  ?  ?ID:  Wyatt Duran, DOB [birth year], MRN [id number] ?  ?PCP:  Wyatt Small, MD (Inactive)      ?  ?[facility] Electrophysiologist:  Wyatt Epley, MD  ?  ?  ?Interval History:   ?  ?Wyatt Duran is a 76 y.o. male who presents for a follow up visit.  He is a previous patient of Dr. Rayann Duran.  He was seen by Wyatt Duran in the A. fib clinic on May 24, 2021.  Patient has a history of atrial fibrillation flutter and underwent a PVI/CTI ablation by Dr. Rayann Duran December 06, 2011.  He developed recurrence of his arrhythmia in October 2022 with palpitations and fatigue.  He is on Xarelto for stroke prophylaxis.  And when a cardioversion on May 04, 2021 but had recurrence of atrial fibrillation 2 to 3 days after his cardioversion. ?  ?  ?He is symptomatic when he is in atrial fibrillation with fatigue and decreased exercise tolerance.  He is very active.  He is always working outside and walking. ?  ? Doing well today. No trouble with anticoagulant since I last saw him. ?  ?  ?  ?Objective  ?  ?  ?    ?Past Medical History:  ?Diagnosis Date  ? Atrial fibrillation ([place])    ? Atrial flutter ([place]) 09/2011  ? DJD (degenerative joint disease)    ? Dysrhythmia    ?  a-fib  ? GERD (gastroesophageal reflux disease)    ? Lumbar disc disease    ? Syncope 2012  ?  negative workup  ?  ?  ?     ?Past Surgical History:  ?Procedure Laterality Date  ? APPENDECTOMY      ? ATRIAL FIBRILLATION ABLATION N/A 12/06/2011  ?  Procedure: ATRIAL FIBRILLATION ABLATION;  Surgeon: Wyatt Grayer, MD;  Location: [facility] CATH LAB;  Service: Cardiovascular;  Laterality: N/A;  ? Atrial fibrillation and Atrial flutter ablation   12/06/2011  ?  PVI and CTI ablation by Dr Wyatt Duran  ? BACK SURGERY      ? CARDIOVERSION   11/30/2011  ?  Procedure: CARDIOVERSION;  Surgeon: Wyatt Reedy, MD;  Location: [facility];  Service: Cardiovascular;  Laterality: N/A;  ? CARDIOVERSION N/A  05/04/2021  ?  Procedure: CARDIOVERSION;  Surgeon: Wyatt Lean, MD;  Location: [facility] ENDOSCOPY;  Service: Cardiovascular;  Laterality: N/A;  ? ENDOVENOUS ABLATION SAPHENOUS VEIN W/ LASER Left 08/08/2017  ?  endovenous laser ablation L greater saphenous vein by Wyatt Gens MD   ? KNEE SURGERY      ? LAPAROSCOPIC CHOLECYSTECTOMY      ? LUMBAR LAMINECTOMY      ? REPLACEMENT TOTAL KNEE Right 08/21/2020  ?  Dr. Laurance Duran in [place]  ? stab phlebectomy Left 11/28/2017  ?  stab phlebectomy 10-20 incisions left leg by Wyatt Gens MD   ? TEE WITHOUT CARDIOVERSION   12/06/2011  ?  Procedure: TRANSESOPHAGEAL ECHOCARDIOGRAM (TEE);  Surgeon: Wyatt Artist, MD;  Location: [facility] ENDOSCOPY;  Service: Cardiovascular;  Laterality: N/A;  ? TRIGGER FINGER RELEASE Left 07/12/2019  ?  Procedure: RELEASE TRIGGER FINGER/A-1 PULLEY LEFT MIDDLE FINGER;  Surgeon: Wyatt Brod, MD;  Location: [facility];  Service: Orthopedics;  Laterality: Left;  IV REGIONAL FOREARM BLOCK  ?  ?  ?Current Medications: ?Active Medications  ?    ?Current Meds  ?Medication Sig  ? diltiazem (CARDIZEM  CD) 240 MG 24 hr capsule Take 1 capsule (240 mg total) by mouth daily.  ? omeprazole (PRILOSEC) 20 MG capsule Take 20 mg by mouth every other day.  ? rivaroxaban (XARELTO) 20 MG TABS tablet Take 1 tablet (20 mg total) by mouth daily with supper.  ?  ?  ?  ?Allergies:   Patient has no known allergies.  ?  ?Social History  ?  ?     ?Socioeconomic History  ? Marital status: Married  ?    Spouse name: Not on file  ? Number of children: Not on file  ? Years of education: Not on file  ? Highest education level: Not on file  ?Occupational History  ? Not on file  ?Tobacco Use  ? Smoking status: Never  ? Smokeless tobacco: Never  ?Vaping Use  ? Vaping Use: Never used  ?Substance and Sexual Activity  ? Alcohol use: Yes  ?    Comment: couple of glasses of wine per week  ? Drug use: No  ? Sexual activity: Not on file  ?Other Topics Concern  ? Not on file   ?Social History Narrative  ?  Pt lives in [place].  Consultant in Advertising copywriter.   ?  ?Social Determinants of Health  ?  ?Financial Resource Strain: Not on file  ?Food Insecurity: Not on file  ?Transportation Needs: Not on file  ?Physical Activity: Not on file  ?Stress: Not on file  ?Social Connections: Not on file  ?  ?  ?Family History: ?The patient's family history includes Alcoholism in his father; Atrial fibrillation in his brother and mother; Heart attack in his father and mother. ?  ?ROS:   ?Please see the history of present illness.    ?All other systems reviewed and are negative. ?  ?EKGs/Labs/Other Studies Reviewed:   ?  ?The following studies were reviewed today: ?  ?Prior records ? ?2013 echo showed normal LV function ?  ?EKG:  The ekg ordered today demonstrates atrial fibrillation with a ventricular rate of 91 bpm.  PVC. ?  ?Recent Labs: ?04/12/2021: Platelets 230 ?05/04/2021: BUN 23; Creatinine, Ser 1.00; Hemoglobin 15.6; Potassium 4.3; Sodium 140  ?Recent Lipid Panel ?Labs (Brief)  ?No results found for: CHOL, TRIG, HDL, CHOLHDL, VLDL, LDLCALC, LDLDIRECT  ? ?  ?Physical Exam:   ?  ?VS:  BP 128/70   Pulse 91   Ht 6' (1.829 m)   Wt 224 lb 12.8 oz (102 kg)   SpO2 99%   BMI 30.49 kg/m?    ?  ?   ?Wt Readings from Last 3 Encounters:  ?06/10/21 224 lb 12.8 oz (102 kg)  ?05/24/21 224 lb 9.6 oz (101.9 kg)  ?05/11/21 223 lb 3.2 oz (101.2 kg)  ?  ?  ?GEN: Well nourished, well developed in no acute distress.  Appears younger than stated age ?HEENT: Normal ?NECK: No JVD; No carotid bruits ?LYMPHATICS: No lymphadenopathy ?CARDIAC: Irregularly irregular, no murmurs, rubs, gallops ?RESPIRATORY:  Clear to auscultation without rales, wheezing or rhonchi  ?ABDOMEN: Soft, non-tender, non-distended ?MUSCULOSKELETAL:  No edema; No deformity  ?SKIN: Warm and dry ?NEUROLOGIC:  Alert and oriented x 3 ?PSYCHIATRIC:  Normal affect  ?  ?  ?  ?  ?  ?Assessment ?  ?  ?ASSESSMENT:   ?  ?1. Persistent atrial  fibrillation ([place])   ?2. Atrial flutter   ?  ?PLAN:   ?  ?In order of problems listed above: ?  ?#Persistent atrial fibrillation ?Symptomatic.  Did well for many years after his first ablation 9-1/2 years ago.  Has since had a recurrence of symptomatic A. fib.  He would like to avoid long-term use of medications if at all possible.  I discussed catheter ablation in detail during today's visit include the risks recovery and likelihood of success and he wishes to proceed.  He will need an echo and a CT pulmonary vein protocol prior to the procedure. ?  ?  ?Risk, benefits, and alternatives to EP study and radiofrequency ablation for afib were also discussed in detail today. These risks include but are not limited to stroke, bleeding, vascular damage, tamponade, perforation, damage to the esophagus, lungs, and other structures, pulmonary vein stenosis, worsening renal function, and death. The patient understands these risk and wishes to proceed.  We will therefore proceed with catheter ablation at the next available time.  Carto, ICE, anesthesia are requested for the procedure.  Will also obtain CT PV protocol prior to the procedure to exclude LAA thrombus and further evaluate atrial anatomy. ?  ?I will plan to check the pulmonary veins, complete the posterior wall and check the CTI.  We will also check for non-PV triggers using Isopril during the procedure. ?  ?  ?  ?  ? ? ?------------------------------------------------- ? ?I have seen, examined the patient, and reviewed the above assessment and plan.   ? ?Plan for redo PVI today. Procedure reviewed. ? ?Wyatt Epley, MD ?09/20/2021 ?7:12 AM ? ?

## 2021-09-20 NOTE — Anesthesia Preprocedure Evaluation (Signed)
Anesthesia Evaluation  ?Patient identified by MRN, date of birth, ID band ?Patient awake ? ? ? ?Reviewed: ?Allergy & Precautions, NPO status , Patient's Chart, lab work & pertinent test results ? ?Airway ?Mallampati: II ? ?TM Distance: >3 FB ?Neck ROM: Full ? ? ? Dental ?no notable dental hx. ? ?  ?Pulmonary ?neg pulmonary ROS,  ?  ?Pulmonary exam normal ?breath sounds clear to auscultation ? ? ? ? ? ? Cardiovascular ?Normal cardiovascular exam+ dysrhythmias Atrial Fibrillation  ?Rhythm:Regular Rate:Normal ? ? ?  ?Neuro/Psych ?negative neurological ROS ? negative psych ROS  ? GI/Hepatic ?Neg liver ROS, GERD  ,  ?Endo/Other  ?negative endocrine ROS ? Renal/GU ?negative Renal ROS  ?negative genitourinary ?  ?Musculoskeletal ? ?(+) Arthritis , Osteoarthritis,   ? Abdominal ?  ?Peds ?negative pediatric ROS ?(+)  Hematology ?negative hematology ROS ?(+)   ?Anesthesia Other Findings ? ? Reproductive/Obstetrics ?negative OB ROS ? ?  ? ? ? ? ? ? ? ? ? ? ? ? ? ?  ?  ? ? ? ? ? ? ? ? ?Anesthesia Physical ?Anesthesia Plan ? ?ASA: 3 ? ?Anesthesia Plan: General  ? ?Post-op Pain Management: Dilaudid IV and Minimal or no pain anticipated  ? ?Induction: Intravenous ? ?PONV Risk Score and Plan: 2 and Ondansetron, Midazolam and Treatment may vary due to age or medical condition ? ?Airway Management Planned: Oral ETT ? ?Additional Equipment:  ? ?Intra-op Plan:  ? ?Post-operative Plan: Extubation in OR ? ?Informed Consent: I have reviewed the patients History and Physical, chart, labs and discussed the procedure including the risks, benefits and alternatives for the proposed anesthesia with the patient or authorized representative who has indicated his/her understanding and acceptance.  ? ? ? ?Dental advisory given ? ?Plan Discussed with: CRNA ? ?Anesthesia Plan Comments:   ? ? ? ? ? ? ?Anesthesia Quick Evaluation ? ?

## 2021-09-20 NOTE — Transfer of Care (Signed)
Immediate Anesthesia Transfer of Care Note ? ?Patient: [name] ? ?Procedure(s) Performed: ATRIAL FIBRILLATION ABLATION ? ?Patient Location: PACU and Cath Lab ? ?Anesthesia Type:General ? ?Level of Consciousness: awake, patient cooperative and responds to stimulation ? ?Airway & Oxygen Therapy: Patient Spontanous Breathing and Patient connected to nasal cannula oxygen ? ?Post-op Assessment: Report given to RN and Post -op Vital signs reviewed and stable ? ?Post vital signs: Reviewed and stable ? ?Last Vitals:  ?Vitals Value Taken Time  ?BP 136/88 09/20/21 0947  ?Temp    ?Pulse 67 09/20/21 0951  ?Resp 16 09/20/21 0951  ?SpO2 97 % 09/20/21 0951  ?Vitals shown include unvalidated device data. ? ?Last Pain:  ?Vitals:  ? 09/20/21 0608  ?TempSrc:   ?PainSc: 0-No pain  ?   ? ?Patients Stated Pain Goal: 3 (09/20/21 2035) ? ?Complications: No notable events documented. ?

## 2021-10-18 ENCOUNTER — Ambulatory Visit (HOSPITAL_COMMUNITY)
Admission: RE | Admit: 2021-10-18 | Discharge: 2021-10-18 | Disposition: A | Discharge status: Home / Self Care | Payer: Medicare Other | Source: Ambulatory Visit | Attending: Physician Assistant | Admitting: Physician Assistant

## 2021-10-18 VITALS — BP 156/86 | HR 64 | Ht 72.0 in | Wt 227.6 lb

## 2021-10-18 DIAGNOSIS — Z7901 Long term (current) use of anticoagulants: Secondary | ICD-10-CM | POA: Insufficient documentation

## 2021-10-18 DIAGNOSIS — Z6830 Body mass index (BMI) 30.0-30.9, adult: Secondary | ICD-10-CM | POA: Insufficient documentation

## 2021-10-18 DIAGNOSIS — K219 Gastro-esophageal reflux disease without esophagitis: Secondary | ICD-10-CM | POA: Diagnosis not present

## 2021-10-18 DIAGNOSIS — I5020 Unspecified systolic (congestive) heart failure: Secondary | ICD-10-CM | POA: Insufficient documentation

## 2021-10-18 DIAGNOSIS — Z79899 Other long term (current) drug therapy: Secondary | ICD-10-CM | POA: Insufficient documentation

## 2021-10-18 DIAGNOSIS — D6869 Other thrombophilia: Secondary | ICD-10-CM | POA: Insufficient documentation

## 2021-10-18 DIAGNOSIS — I4819 Other persistent atrial fibrillation: Secondary | ICD-10-CM | POA: Insufficient documentation

## 2021-10-18 DIAGNOSIS — I4892 Unspecified atrial flutter: Secondary | ICD-10-CM | POA: Diagnosis not present

## 2021-10-18 DIAGNOSIS — E669 Obesity, unspecified: Secondary | ICD-10-CM | POA: Diagnosis not present

## 2021-10-18 NOTE — Progress Notes (Signed)
? ? ?Primary Care Physician: London Pepper, MD ?Primary Cardiologist: Dr Bettina Gavia ?Primary Electrophysiologist: Dr Quentin Ore ?Referring Physician: Dr Rayann Heman ? ? ?Wyatt Duran is a 76 y.o. male with a history of atrial flutter and atrial fibrillation who presents for follow up in the [facility]. He was diagnosed with afib on routine physical in 2013.  He had atrial flutter as well.  He failed medical therapy with flecainide.  He underwent PVI and CTI ablations by Dr Rayann Heman 12/06/2011. His afib initially resolved. He developed recurrent afib 04/09/21 with symptoms of palpitations and fatigue.  He was evaluated by Dr Bettina Gavia and started on xarelto 04/12/21.  He reports compliance without interruption since then. He was diagnosed with COVID 04/14/21.  In retrospect, it could have been covid that caused his AF recurrence. Patient is on Xarelto for a CHADS2VASC score of 3. Patient is s/p DCCV on 05/04/21. Unfortunately, he had early return of afib, about 2-3 days after DCCV.  ? ?On follow up today, patient is s/p afib ablation with Dr Quentin Ore on 09/20/21. Patient has done very well since the procedure with no afib noted on his smart watch. He denies CP, swallowing pain, or groin issues. He did have an echo prior to ablation which showed mildly reduced EF 40-45%.   ? ?Today, he denies symptoms of palpitations, chest pain, shortness of breath, orthopnea, PND, lower extremity edema, dizziness, presyncope, syncope, snoring, daytime somnolence, bleeding, or neurologic sequela. The patient is tolerating medications without difficulties and is otherwise without complaint today.  ? ? ?Atrial Fibrillation Risk Factors: ? ?he does not have symptoms or diagnosis of sleep apnea. ?he does not have a history of rheumatic fever. ? ? ?he has a BMI of Body mass index is 30.87 kg/m?Marland KitchenMarland Kitchen ?Filed Weights  ? 10/18/21 1032  ?Weight: 103.2 kg  ? ? ? ?Family History  ?Problem Relation Age of Onset  ? Alcoholism Father   ?  Heart attack Father   ? Heart attack Mother   ? Atrial fibrillation Mother   ? Atrial fibrillation Brother   ? ? ? ?Atrial Fibrillation Management history: ? ?Previous antiarrhythmic drugs: flecainide ?Previous cardioversions: 2013, 05/04/21 ?Previous ablations: 2013 ?CHADS2VASC score: 3 ?Anticoagulation history: Xarelto  ? ? ?Past Medical History:  ?Diagnosis Date  ? Atrial fibrillation ([place])   ? Atrial flutter ([place]) 09/2011  ? DJD (degenerative joint disease)   ? Dysrhythmia   ? a-fib  ? GERD (gastroesophageal reflux disease)   ? Lumbar disc disease   ? Syncope 2012  ? negative workup  ? ?Past Surgical History:  ?Procedure Laterality Date  ? APPENDECTOMY    ? ATRIAL FIBRILLATION ABLATION N/A 12/06/2011  ? Procedure: ATRIAL FIBRILLATION ABLATION;  Surgeon: Thompson Grayer, MD;  Location: [facility] CATH LAB;  Service: Cardiovascular;  Laterality: N/A;  ? ATRIAL FIBRILLATION ABLATION N/A 09/20/2021  ? Procedure: ATRIAL FIBRILLATION ABLATION;  Surgeon: Vickie Epley, MD;  Location: [facility] CV LAB;  Service: Cardiovascular;  Laterality: N/A;  ? Atrial fibrillation and Atrial flutter ablation  12/06/2011  ? PVI and CTI ablation by Dr Rayann Heman  ? BACK SURGERY    ? CARDIOVERSION  11/30/2011  ? Procedure: CARDIOVERSION;  Surgeon: Jacolyn Reedy, MD;  Location: [facility];  Service: Cardiovascular;  Laterality: N/A;  ? CARDIOVERSION N/A 05/04/2021  ? Procedure: CARDIOVERSION;  Surgeon: Werner Lean, MD;  Location: [facility] ENDOSCOPY;  Service: Cardiovascular;  Laterality: N/A;  ? ENDOVENOUS ABLATION SAPHENOUS VEIN W/ LASER Left 08/08/2017  ? endovenous  laser ablation L greater saphenous vein by Tinnie Gens MD   ? KNEE SURGERY    ? LAPAROSCOPIC CHOLECYSTECTOMY    ? LUMBAR LAMINECTOMY    ? REPLACEMENT TOTAL KNEE Right 08/21/2020  ? Dr. Laurance Flatten in [place]  ? stab phlebectomy Left 11/28/2017  ? stab phlebectomy 10-20 incisions left leg by Tinnie Gens MD   ? TEE WITHOUT CARDIOVERSION  12/06/2011  ? Procedure: TRANSESOPHAGEAL  ECHOCARDIOGRAM (TEE);  Surgeon: Jolaine Artist, MD;  Location: [facility] ENDOSCOPY;  Service: Cardiovascular;  Laterality: N/A;  ? TRIGGER FINGER RELEASE Left 07/12/2019  ? Procedure: RELEASE TRIGGER FINGER/A-1 PULLEY LEFT MIDDLE FINGER;  Surgeon: Daryll Brod, MD;  Location: [facility];  Service: Orthopedics;  Laterality: Left;  IV REGIONAL FOREARM BLOCK  ? ? ?Current Outpatient Medications  ?Medication Sig Dispense Refill  ? cephALEXin (KEFLEX) 500 MG capsule Takes 4 tablets by mouth as needed for dental procedures    ? diltiazem (CARDIZEM CD) 240 MG 24 hr capsule Take 1 capsule by mouth once daily 30 capsule 3  ? omeprazole (PRILOSEC) 20 MG capsule Take 20 mg by mouth every other day.    ? rivaroxaban (XARELTO) 20 MG TABS tablet Take 1 tablet (20 mg total) by mouth daily with supper. 90 tablet 3  ? ?No current facility-administered medications for this encounter.  ? ? ?No Known Allergies ? ?Social History  ? ?Socioeconomic History  ? Marital status: Married  ?  Spouse name: Not on file  ? Number of children: Not on file  ? Years of education: Not on file  ? Highest education level: Not on file  ?Occupational History  ? Not on file  ?Tobacco Use  ? Smoking status: Never  ? Smokeless tobacco: Never  ?Vaping Use  ? Vaping Use: Never used  ?Substance and Sexual Activity  ? Alcohol use: Yes  ?  Comment: couple of glasses of wine per week  ? Drug use: No  ? Sexual activity: Not on file  ?Other Topics Concern  ? Not on file  ?Social History Narrative  ? Pt lives in [place].  Consultant in Advertising copywriter.   ? ?Social Determinants of Health  ? ?Financial Resource Strain: Not on file  ?Food Insecurity: Not on file  ?Transportation Needs: Not on file  ?Physical Activity: Not on file  ?Stress: Not on file  ?Social Connections: Not on file  ?Intimate Partner Violence: Not on file  ? ? ? ?ROS- All systems are reviewed and negative except as per the HPI above. ? ?Physical Exam: ?Vitals:  ? 10/18/21 1032   ?BP: (!) 156/86  ?Pulse: 64  ?Weight: 103.2 kg  ?Height: 6' (1.829 m)  ? ? ? ?GEN- The patient is a well appearing elderly obese male, alert and oriented x 3 today.   ?HEENT-head normocephalic, atraumatic, sclera clear, conjunctiva pink, hearing intact, trachea midline. ?Lungs- Clear to ausculation bilaterally, normal work of breathing ?Heart- Regular rate and rhythm, no murmurs, rubs or gallops  ?GI- soft, NT, ND, + BS ?Extremities- no clubbing, cyanosis, or edema ?MS- no significant deformity or atrophy ?Skin- no rash or lesion ?Psych- euthymic mood, full affect ?Neuro- strength and sensation are intact ? ? ?Wt Readings from Last 3 Encounters:  ?10/18/21 103.2 kg  ?09/20/21 99.8 kg  ?06/10/21 102 kg  ? ? ?EKG today demonstrates  ?SR ?Vent. rate 64 BPM ?PR interval 168 ms ?QRS duration 114 ms ?QT/QTcB 412/425 ms ? ?Echo 06/25/21 demonstrated  ?1. Left ventricular ejection fraction, by  estimation, is 40 to 45%. The  ?left ventricle has mildly decreased function. The left ventricle  ?demonstrates global hypokinesis. Left ventricular diastolic function could not be evaluated.  ? 2. Right ventricular systolic function is normal. The right ventricular  ?size is normal. There is normal pulmonary artery systolic pressure.  ? 3. Left atrial size was moderately dilated.  ? 4. Right atrial size was mild to moderately dilated.  ? 5. The mitral valve is normal in structure. Mild mitral valve  ?regurgitation. No evidence of mitral stenosis.  ? 6. The aortic valve is tricuspid. Aortic valve regurgitation is mild. No  ?aortic stenosis is present.  ? 7. Aortic dilatation noted. There is mild dilatation of the ascending  ?aorta, measuring 42 mm.  ? ?Epic records are reviewed at length today ? ?CHA2DS2-VASc Score = 3  ?The patient's score is based upon: ?CHF History: 1 ?HTN History: 0 ?Diabetes History: 0 ?Stroke History: 0 ?Vascular Disease History: 0 ?Age Score: 2 ?Gender Score: 0 ?    ? ? ? ?ASSESSMENT AND PLAN: ?1. Persistent  Atrial Fibrillation/atrial flutter ?The patient's CHA2DS2-VASc score is 3, indicating a 3.2% annual risk of stroke.   ?S/p afib and flutter ablation 2013 with repeat ablation 09/20/21 ?Patient appears to be ma

## 2021-11-01 DIAGNOSIS — E669 Obesity, unspecified: Secondary | ICD-10-CM | POA: Insufficient documentation

## 2021-12-09 ENCOUNTER — Encounter (INDEPENDENT_AMBULATORY_CARE_PROVIDER_SITE_OTHER): Payer: Medicare Other | Admitting: Ophthalmology

## 2021-12-09 DIAGNOSIS — H43813 Vitreous degeneration, bilateral: Secondary | ICD-10-CM

## 2021-12-09 DIAGNOSIS — H35371 Puckering of macula, right eye: Secondary | ICD-10-CM

## 2021-12-09 DIAGNOSIS — H35341 Macular cyst, hole, or pseudohole, right eye: Secondary | ICD-10-CM | POA: Diagnosis not present

## 2021-12-16 ENCOUNTER — Other Ambulatory Visit (HOSPITAL_COMMUNITY): Payer: Self-pay | Admitting: Physician Assistant

## 2021-12-21 ENCOUNTER — Encounter: Payer: Self-pay | Admitting: Cardiology

## 2021-12-21 ENCOUNTER — Ambulatory Visit (INDEPENDENT_AMBULATORY_CARE_PROVIDER_SITE_OTHER): Payer: Medicare Other | Admitting: Cardiology

## 2021-12-21 VITALS — BP 138/74 | HR 67 | Ht 72.0 in | Wt 232.0 lb

## 2021-12-21 DIAGNOSIS — I5022 Chronic systolic (congestive) heart failure: Secondary | ICD-10-CM

## 2021-12-21 DIAGNOSIS — I4892 Unspecified atrial flutter: Secondary | ICD-10-CM | POA: Diagnosis not present

## 2021-12-21 DIAGNOSIS — I4819 Other persistent atrial fibrillation: Secondary | ICD-10-CM | POA: Diagnosis not present

## 2021-12-21 DIAGNOSIS — I495 Sick sinus syndrome: Secondary | ICD-10-CM

## 2021-12-21 MED ORDER — DILTIAZEM HCL ER COATED BEADS 120 MG PO CP24: 120.0000 mg | ORAL_CAPSULE | Freq: Every day | ORAL | 3 refills | Status: DC

## 2021-12-21 NOTE — Patient Instructions (Addendum)
Medication Instructions:  Decrease Diltiazem to 120 mg daily  Your physician recommends that you continue on your current medications as directed. Please refer to the Current Medication list given to you today. *If you need a refill on your cardiac medications before your next appointment, please call your pharmacy*  Lab Work: None. If you have labs (blood work) drawn today and your tests are completely normal, you will receive your results only by: [place] (if you have MyChart) OR A paper copy in the mail If you have any lab test that is abnormal or we need to change your treatment, we will call you to review the results.  Testing/Procedures: Your physician has requested that you have an echocardiogram. Echocardiography is a painless test that uses sound waves to create images of your heart. It provides your doctor with information about the size and shape of your heart and how well your heart's chambers and valves are working. This procedure takes approximately one hour. There are no restrictions for this procedure.   Follow-Up: At [facility], you and your health needs are our priority.  As part of our continuing mission to provide you with exceptional heart care, we have created designated Provider Care Teams.  These Care Teams include your primary Cardiologist (physician) and Advanced Practice Providers (APPs -  Physician Assistants and Nurse Practitioners) who all work together to provide you with the care you need, when you need it.  Your physician wants you to follow-up in: 12 months with one of the following Advanced Practice Providers on your designated Care Team:    Tommye Standard, PA-C Legrand Como "Jonni Sanger" [place], [place]   You will receive a reminder letter in the mail two months in advance. If you don't receive a letter, please call our office to schedule the follow-up appointment.  We recommend signing up for the patient portal called "MyChart".  Sign up information is provided  on this After Visit Summary.  MyChart is used to connect with patients for Virtual Visits (Telemedicine).  Patients are able to view lab/test results, encounter notes, upcoming appointments, etc.  Non-urgent messages can be sent to your provider as well.   To learn more about what you can do with MyChart, go to [email or web address].    Any Other Special Instructions Will Be Listed Below (If Applicable).

## 2021-12-21 NOTE — Progress Notes (Signed)
Electrophysiology Office Follow up Visit Note:    Date:  12/21/2021   ID:  Wyatt Duran, DOB 1945-08-29, MRN [id number]  PCP:  London Pepper, MD  [facility] Cardiologist:  None  [facility] Electrophysiologist:  Vickie Epley, MD    Interval History:    Wyatt Duran is a 76 y.o. male who presents for a follow up visit. They were last seen in clinic 06/10/2021.  Since their last appointment, they underwent atrial fibrillation ablation on 09/20/2021. They followed up with Adline Peals, PA on 10/18/2021 where he was doing well with no recurrent Afib episodes detected by his smart watch. His EKG showed SR at 64 bpm, PR interval 168 ms, QRS duration 114 ms, QT/QTcB 412/425 ms.  Today, he is accompanied by his wife. Overall he has been doing well. As far as he knows he has not had any recurrent arrhythmic episodes.   At home he does not monitor his blood pressure. Previously while on 120 mg diltiazem he did not notice much of a difference.  In his family, his grandfather, brother, and 2 first cousins, all have atrial fibrillation. They suspect her mother may have had Afib, but are not certain.  He denies any palpitations, chest pain, shortness of breath, or peripheral edema. No lightheadedness, headaches, syncope, orthopnea, or PND.  He is also followed by Dr. Bettina Gavia.     Past Medical History:  Diagnosis Date   Atrial fibrillation [facility])    Atrial flutter ([place]) 09/2011   DJD (degenerative joint disease)    Dysrhythmia    a-fib   GERD (gastroesophageal reflux disease)    Lumbar disc disease    Syncope 2012   negative workup    Past Surgical History:  Procedure Laterality Date   APPENDECTOMY     ATRIAL FIBRILLATION ABLATION N/A 12/06/2011   Procedure: ATRIAL FIBRILLATION ABLATION;  Surgeon: Thompson Grayer, MD;  Location: [facility] CATH LAB;  Service: Cardiovascular;  Laterality: N/A;   ATRIAL FIBRILLATION ABLATION N/A 09/20/2021   Procedure: ATRIAL FIBRILLATION  ABLATION;  Surgeon: Vickie Epley, MD;  Location: [place] CV LAB;  Service: Cardiovascular;  Laterality: N/A;   Atrial fibrillation and Atrial flutter ablation  12/06/2011   PVI and CTI ablation by Dr Norman Herrlich SURGERY     CARDIOVERSION  11/30/2011   Procedure: CARDIOVERSION;  Surgeon: Jacolyn Reedy, MD;  Location: [place];  Service: Cardiovascular;  Laterality: N/A;   CARDIOVERSION N/A 05/04/2021   Procedure: CARDIOVERSION;  Surgeon: Werner Lean, MD;  Location: [place] ENDOSCOPY;  Service: Cardiovascular;  Laterality: N/A;   ENDOVENOUS ABLATION SAPHENOUS VEIN W/ LASER Left 08/08/2017   endovenous laser ablation L greater saphenous vein by Tinnie Gens MD    [place] Right 08/21/2020   Dr. Laurance Flatten in [place]   stab phlebectomy Left 11/28/2017   stab phlebectomy 10-20 incisions left leg by Tinnie Gens MD    TEE WITHOUT CARDIOVERSION  12/06/2011   Procedure: TRANSESOPHAGEAL ECHOCARDIOGRAM (TEE);  Surgeon: Jolaine Artist, MD;  Location: [facility] ENDOSCOPY;  Service: Cardiovascular;  Laterality: N/A;   TRIGGER FINGER RELEASE Left 07/12/2019   Procedure: RELEASE TRIGGER FINGER/A-1 PULLEY LEFT MIDDLE FINGER;  Surgeon: Daryll Brod, MD;  Location: [place];  Service: Orthopedics;  Laterality: Left;  IV REGIONAL FOREARM BLOCK    Current Medications: Current Meds  Medication Sig   cephALEXin (KEFLEX) 500  MG capsule Takes 4 tablets by mouth as needed for dental procedures   diltiazem (CARDIZEM CD) 120 MG 24 hr capsule Take 1 capsule (120 mg total) by mouth daily.   omeprazole (PRILOSEC) 20 MG capsule Take 20 mg by mouth every other day.   rivaroxaban (XARELTO) 20 MG TABS tablet Take 1 tablet (20 mg total) by mouth daily with supper.   [DISCONTINUED] diltiazem (CARDIZEM CD) 240 MG 24 hr capsule Take 1 capsule by mouth once daily     Allergies:   Patient has no known  allergies.   Social History   Socioeconomic History   Marital status: Married    Spouse name: Not on file   Number of children: Not on file   Years of education: Not on file   Highest education level: Not on file  Occupational History   Not on file  Tobacco Use   Smoking status: Never   Smokeless tobacco: Never  Vaping Use   Vaping Use: Never used  Substance and Sexual Activity   Alcohol use: Yes    Comment: couple of glasses of wine per week   Drug use: No   Sexual activity: Not on file  Other Topics Concern   Not on file  Social History Narrative   Pt lives in [place].  Consultant in Advertising copywriter.    Social Determinants of Health   Financial Resource Strain: Not on file  Food Insecurity: Not on file  Transportation Needs: Not on file  Physical Activity: Not on file  Stress: Not on file  Social Connections: Not on file     Family History: The patient's family history includes Alcoholism in his father; Atrial fibrillation in his brother and mother; Heart attack in his father and mother.  ROS:   Please see the history of present illness.    All other systems reviewed and are negative.  EKGs/Labs/Other Studies Reviewed:    The following studies were reviewed today:  09/20/2021  Atrial fibrillation ablation: CONCLUSIONS: 1. Successful redo PVI with reisolation of the right pulmonary veins 2. Successful ablation/isolation of the posterior wall 3. Evidence of conduction block across the CTI at baseline 4. Intracardiac echo reveals enlarged LA, 4 distinct PV's, large RIPV, trivial pericardial effusion 5. No early apparent complications. 6. Colchicine 0.'6mg'$  PO BID x 5 days 7. Continue outpatient PPI  09/13/2021  Cardiac CT: FINDINGS: Image quality: Excellent.   Noise artifact is: Limited   Pulmonary Veins: There is normal pulmonary vein drainage into the left atrium (2 on the right and 2 on the left) with ostial measurements as follows:    RUPV: Ostium 22 x 19 mm  area 2.94 cm2   RLPV:  Ostium 29 x 28 mm  area 6.3 cm2   LUPV:  Ostium 19 x 13 mm area 1.91 cm2   LLPV:  Ostium 22 x 20  area 3.5 cm2   Left Atrium: The left atrial size is normal. There is no LA appendage thrombus. There is no PFO/ASD. The esophagus runs in the left atrial midline and is not in proximity to any of the pulmonary vein ostia.   Coronary Arteries: Not performed.   Extra-cardiac findings: See attached radiology report for non-cardiac structures.   IMPRESSION: 1. There is normal pulmonary vein drainage into the left atrium with ostial measurements above.   2. There is no thrombus in the left atrial appendage.   3. The esophagus runs in the left atrial midline and is not in proximity to any  of the pulmonary vein ostia.   4. No PFO/ASD.   5. Normal coronary origin. Right dominance.  06/25/2021  Echo:  1. Left ventricular ejection fraction, by estimation, is 40 to 45%. The  left ventricle has mildly decreased function. The left ventricle  demonstrates global hypokinesis. Left ventricular diastolic function could  not be evaluated.   2. Right ventricular systolic function is normal. The right ventricular  size is normal. There is normal pulmonary artery systolic pressure.   3. Left atrial size was moderately dilated.   4. Right atrial size was mild to moderately dilated.   5. The mitral valve is normal in structure. Mild mitral valve  regurgitation. No evidence of mitral stenosis.   6. The aortic valve is tricuspid. Aortic valve regurgitation is mild. No  aortic stenosis is present.   7. Aortic dilatation noted. There is mild dilatation of the ascending  aorta, measuring 42 mm.   Comparison(s): No prior Echocardiogram.   Conclusion(s)/Recommendation(s): Mildly reduced LVEF with global  hypokinesis. Mild dilation of ascending aorta.    Prior records  2013 echo showed normal LV function  EKG:  EKG is personally reviewed.   12/21/2021: Sinus rhythm.  Recent Labs: 08/31/2021: BUN 16; Creatinine, Ser 1.09; Hemoglobin 14.9; Platelets 229; Potassium 4.1; Sodium 144   Recent Lipid Panel No results found for: "CHOL", "TRIG", "HDL", "CHOLHDL", "VLDL", "LDLCALC", "LDLDIRECT"  Physical Exam:    VS:  BP 138/74   Pulse 67   Ht 6' (1.829 m)   Wt 232 lb (105.2 kg)   SpO2 94%   BMI 31.46 kg/m     Wt Readings from Last 3 Encounters:  12/21/21 232 lb (105.2 kg)  10/18/21 227 lb 9.6 oz (103.2 kg)  09/20/21 220 lb (99.8 kg)     GEN: Well nourished, well developed in no acute distress HEENT: Normal NECK: No JVD; No carotid bruits LYMPHATICS: No lymphadenopathy CARDIAC: RRR, no murmurs, rubs, gallops RESPIRATORY:  Clear to auscultation without rales, wheezing or rhonchi  ABDOMEN: Soft, non-tender, non-distended MUSCULOSKELETAL:  No edema; No deformity  SKIN: Warm and dry NEUROLOGIC:  Alert and oriented x 3 PSYCHIATRIC:  Normal affect        ASSESSMENT:    1. Persistent atrial fibrillation ([place])   2. Tachycardia-bradycardia ([place])   3. Atrial flutter    PLAN:    In order of problems listed above:   #Persistent atrial fibrillation #Tachybradycardia syndrome Doing well after his ablation without recurrence of arrhythmia.  On Xarelto for stroke prophylaxis.  I will decrease his diltiazem to 120 mg by mouth once daily today.  #Chronic systolic heart failure December 2022 echo shows an ejection fraction of 40 to 45% in the setting of atrial fibrillation.  Suspect it was tachycardia mediated.  Now that he has been back in normal rhythm, favor repeating the echo at this point to guide future medical therapy.  Decrease calcium channel blocker as above.  Follow-up in 1 year with APP.   Medication Adjustments/Labs and Tests Ordered: Current medicines are reviewed at length with the patient today.  Concerns regarding medicines are outlined above.  Orders Placed This Encounter  Procedures   EKG 12-Lead    ECHOCARDIOGRAM COMPLETE   Meds ordered this encounter  Medications   diltiazem (CARDIZEM CD) 120 MG 24 hr capsule    Sig: Take 1 capsule (120 mg total) by mouth daily.    Dispense:  90 capsule    Refill:  3    I,Mathew Stumpf,acting as a scribe  for Vickie Epley, MD.,have documented all relevant documentation on the behalf of Vickie Epley, MD,as directed by  Vickie Epley, MD while in the presence of Vickie Epley, MD.  I, Vickie Epley, MD, have reviewed all documentation for this visit. The documentation on 12/21/21 for the exam, diagnosis, procedures, and orders are all accurate and complete.   Signed, Lars Mage, MD, [facility], [facility] 12/21/2021 10:00 PM    Electrophysiology [facility]

## 2022-01-12 ENCOUNTER — Ambulatory Visit (HOSPITAL_COMMUNITY): Payer: Medicare Other | Attending: Internal Medicine

## 2022-01-12 ENCOUNTER — Other Ambulatory Visit (HOSPITAL_COMMUNITY): Payer: Medicare Other

## 2022-01-12 DIAGNOSIS — I4892 Unspecified atrial flutter: Secondary | ICD-10-CM | POA: Diagnosis present

## 2022-01-12 DIAGNOSIS — I4819 Other persistent atrial fibrillation: Secondary | ICD-10-CM

## 2022-01-12 DIAGNOSIS — I495 Sick sinus syndrome: Secondary | ICD-10-CM | POA: Diagnosis present

## 2022-01-12 LAB — ECHOCARDIOGRAM COMPLETE
Area-P 1/2: 3.33 cm2
P 1/2 time: 409 msec
S' Lateral: 3.6 cm

## 2022-01-14 ENCOUNTER — Telehealth: Payer: Self-pay | Admitting: Cardiology

## 2022-01-14 DIAGNOSIS — I700 Atherosclerosis of aorta: Secondary | ICD-10-CM

## 2022-01-14 NOTE — Telephone Encounter (Signed)
Patient is having surgery on 7/26, he wants to know when he can stop taking his XARELTO.

## 2022-01-17 DIAGNOSIS — I700 Atherosclerosis of aorta: Secondary | ICD-10-CM | POA: Insufficient documentation

## 2022-01-17 NOTE — Telephone Encounter (Signed)
   Pre-operative Risk Assessment    Patient Name: Wyatt Duran  DOB: 11/28/1945 MRN: [id number]      Request for Surgical Clearance    Procedure:   CYSTOSCOPY PROSTATE TRANSURETHRAL RESECTION   Date of Surgery:  Clearance 01/26/22                                 Surgeon:  DR. Cheree Ditto MILLS Surgeon's Group or Practice Name:  [place] Phone number:  [phone] Fax number:  [phone]   Type of Clearance Requested:   - Medical  - Pharmacy:  Hold Rivaroxaban (Xarelto)     Type of Anesthesia:  General    Additional requests/questions:    Jiles Prows   01/17/2022, 11:54 AM

## 2022-01-17 NOTE — Telephone Encounter (Signed)
   Patient Name: Wyatt Duran  DOB: 03-15-1946 MRN: [id number]  Primary Cardiologist: None  Chart reviewed as part of pre-operative protocol coverage.   Per note 5/99/35 "Chronic systolic heart failure December 2022 echo shows an ejection fraction of 40 to 45% in the setting of atrial fibrillation.  Suspect it was tachycardia mediated.  Now that he has been back in normal rhythm, favor repeating the echo at this point to guide future medical therapy.  Decrease calcium channel blocker as above".  Dr. Quentin Ore, does patient needs echo needs prior to clearance?  Pharmacy to review anticoagulation.   [place], [place] 01/17/2022, 4:12 PM

## 2022-01-17 NOTE — Telephone Encounter (Signed)
Patient with diagnosis of atrial fibrillation on Xarelto for anticoagulation.    Procedure: Cytoscopy of the prostate and transurethral resection Date of procedure: 01/26/22     CHA2DS2-VASc Score = 4   This indicates a 4.8% annual risk of stroke. The patient's score is based upon: CHF History: 1 HTN History: 0 Diabetes History: 0 Stroke History: 0 Vascular Disease History: 1 Age Score: 2 Gender Score: 0   CrCl 80.8 ml/min using AdjBW Platelet count 255K  Per office protocol, patient can hold Xarelto for 2-3 days prior to procedure.  Pt underwent ablation >90 days prior on 09/20/21.

## 2022-01-26 DIAGNOSIS — N138 Other obstructive and reflux uropathy: Secondary | ICD-10-CM | POA: Insufficient documentation

## 2022-01-26 DIAGNOSIS — N401 Enlarged prostate with lower urinary tract symptoms: Secondary | ICD-10-CM | POA: Insufficient documentation

## 2022-02-08 NOTE — Telephone Encounter (Signed)
Patient had echocardiogram on 01/12/22 that revealed normal LVEF. Surgery appears to have been completed 01/26/22

## 2022-03-30 ENCOUNTER — Telehealth: Payer: Self-pay | Admitting: Cardiology

## 2022-03-30 NOTE — Telephone Encounter (Signed)
Patient called stating Dr. Wallace Going at [facility] will be calling to coordinate changing his  rivaroxaban (XARELTO) 20 MG TABS tablet medication.  Patient stated Dr. Wallace Going wants to change his medication as it interacts with other medication he will be taking.

## 2022-03-30 NOTE — Telephone Encounter (Signed)
Patient's Urologist called and wanted to know about switching patient's medication from Xarelto to Pradaxa due to him being prescribed Xtandi. Please call back to discuss

## 2022-03-31 ENCOUNTER — Telehealth: Payer: Self-pay | Admitting: Cardiology

## 2022-03-31 MED ORDER — DABIGATRAN ETEXILATE MESYLATE 150 MG PO CAPS: 150.0000 mg | ORAL_CAPSULE | Freq: Two times a day (BID) | ORAL | 5 refills | Status: DC

## 2022-03-31 NOTE — Telephone Encounter (Signed)
Pt c/o medication issue:  1. Name of Medication:  dabigatran (PRADAXA) 150 MG CAPS capsule xarelto  2. How are you currently taking this medication (dosage and times per day)? Needs to verify if patient is taking both  3. Are you having a reaction (difficulty breathing--STAT)? no  4. What is your medication issue? Sonia Baller with [facility] states they received a prescription for pradaxa and and they need to verify if the patient is stopping the xarelto. Phone: [phone]

## 2022-03-31 NOTE — Telephone Encounter (Signed)
This request was sent to [facility], a Pharm D. per Dr. Joya Gaskins recommendation.in another thread for this patient.

## 2022-03-31 NOTE — Telephone Encounter (Signed)
His dose should be dabigatran '150mg'$  twice a day. I sent the Rx to the pharmacy. When he is ready to switch, he will take dabigatran at the time his next Xarelto would have been due. He should monitor for signs of bleeding (blood in stool, coughing up blood, excessive bruising ect). Richard, can you please call Urology office and patient. Thanks

## 2022-03-31 NOTE — Telephone Encounter (Signed)
Called the patient and relayed the following instructions to him from the Pharm D.:  "His dose should be dabigatran '150mg'$  twice a day. I sent the Rx to the pharmacy. When he is ready to switch, he will take dabigatran at the time his next Xarelto would have been due. He should monitor for signs of bleeding (blood in stool, coughing up blood, excessive bruising ect)."  Patient had no further questions at this time.

## 2022-03-31 NOTE — Telephone Encounter (Signed)
Left message with Sharyn Lull from [facility] to call back in order to update her regarding switching from Xarelto to Pradaxa.

## 2022-03-31 NOTE — Telephone Encounter (Signed)
Called and spoke with the pharmacy at [facility] and advised that the Xarelto will be discontinued.

## 2022-03-31 NOTE — Telephone Encounter (Signed)
Beverly Sessions at the [facility] and informed her that the patient was switching from Xarelto to Pradaxa and would not be taking the Xarelto at this time. Sonia Baller had no further questions at this time.

## 2022-03-31 NOTE — Telephone Encounter (Signed)
There is a drug interaction with the Xtandi and both Eliquis and Xarelto. Gillermina Phy is a CYP3A4 inducer and can increase the concentration of Xarelto/Elqiuis. There is also an interaction with pradaxa/savasya. (P-glycoprotein/ABCB1 inh) which can lead to increase concentration of pradaxa but interaction is less severe than the interaction with Eliquis/Xarelto.   Gillermina Phy is a medication to treat his prostate cancer. Warfarin might be the safest option (depending on how well his INR can be controlled and if he can be compliant with INR checks) Pradaxa is a reasonable option.

## 2022-04-01 NOTE — Telephone Encounter (Signed)
Left message with Wyatt Duran from [facility] to call back in order to update her regarding switching from Xarelto to Pradaxa.

## 2022-04-04 NOTE — Telephone Encounter (Signed)
Called Datto from [facility] and informed her of the plan to switch the patient from Xarelto to Pradaxa. She was very appreciative and had no further questions at this time.

## 2022-04-11 NOTE — Progress Notes (Unsigned)
Cardiology Office Note:    Date:  04/11/2022   ID:  Wyatt Duran, DOB August 19, 1945, MRN [id number]  PCP:  London Pepper, MD  Cardiologist:  Shirlee More, MD    Referring MD: London Pepper, MD    ASSESSMENT:    No diagnosis found. PLAN:    In order of problems listed above:  ***   Next appointment: ***   Medication Adjustments/Labs and Tests Ordered: Current medicines are reviewed at length with the patient today.  Concerns regarding medicines are outlined above.  No orders of the defined types were placed in this encounter.  No orders of the defined types were placed in this encounter.   No chief complaint on file.   History of Present Illness:    Wyatt Duran is a 76 y.o. male with a hx of paroxysmal atrial fibrillation with EP catheter ablation January 2013 and hypertensive heart disease last seen 04/22/2021 with atrial fibrillation referred back for EP consultation.. Compliance with diet, lifestyle and medications: *** Past Medical History:  Diagnosis Date   Atrial fibrillation (HCC)    Atrial flutter ([place]) 09/2011   DJD (degenerative joint disease)    Dysrhythmia    a-fib   GERD (gastroesophageal reflux disease)    Lumbar disc disease    Syncope 2012   negative workup    Past Surgical History:  Procedure Laterality Date   APPENDECTOMY     ATRIAL FIBRILLATION ABLATION N/A 12/06/2011   Procedure: ATRIAL FIBRILLATION ABLATION;  Surgeon: Thompson Grayer, MD;  Location: [facility] CATH LAB;  Service: Cardiovascular;  Laterality: N/A;   ATRIAL FIBRILLATION ABLATION N/A 09/20/2021   Procedure: ATRIAL FIBRILLATION ABLATION;  Surgeon: Vickie Epley, MD;  Location: [place] CV LAB;  Service: Cardiovascular;  Laterality: N/A;   Atrial fibrillation and Atrial flutter ablation  12/06/2011   PVI and CTI ablation by Dr Norman Herrlich SURGERY     CARDIOVERSION  11/30/2011   Procedure: CARDIOVERSION;  Surgeon: Jacolyn Reedy, MD;  Location: [place];  Service:  Cardiovascular;  Laterality: N/A;   CARDIOVERSION N/A 05/04/2021   Procedure: CARDIOVERSION;  Surgeon: Werner Lean, MD;  Location: [place] ENDOSCOPY;  Service: Cardiovascular;  Laterality: N/A;   ENDOVENOUS ABLATION SAPHENOUS VEIN W/ LASER Left 08/08/2017   endovenous laser ablation L greater saphenous vein by Tinnie Gens MD    [place] Right 08/21/2020   Dr. Laurance Flatten in [place]   stab phlebectomy Left 11/28/2017   stab phlebectomy 10-20 incisions left leg by Tinnie Gens MD    TEE WITHOUT CARDIOVERSION  12/06/2011   Procedure: TRANSESOPHAGEAL ECHOCARDIOGRAM (TEE);  Surgeon: Jolaine Artist, MD;  Location: [facility] ENDOSCOPY;  Service: Cardiovascular;  Laterality: N/A;   TRIGGER FINGER RELEASE Left 07/12/2019   Procedure: RELEASE TRIGGER FINGER/A-1 PULLEY LEFT MIDDLE FINGER;  Surgeon: Daryll Brod, MD;  Location: [place];  Service: Orthopedics;  Laterality: Left;  IV REGIONAL FOREARM BLOCK    Current Medications: No outpatient medications have been marked as taking for the 04/12/22 encounter (Appointment) with Richardo Priest, MD.     Allergies:   Patient has no known allergies.   Social History   Socioeconomic History   Marital status: Married    Spouse name: Not on file   Number of children: Not on file   Years of education: Not on file   Highest education level: Not on file  Occupational History   Not on file  Tobacco Use   Smoking status: Never   Smokeless tobacco: Never  Vaping Use   Vaping Use: Never used  Substance and Sexual Activity   Alcohol use: Yes    Comment: couple of glasses of wine per week   Drug use: No   Sexual activity: Not on file  Other Topics Concern   Not on file  Social History Narrative   Pt lives in [place].  Consultant in Advertising copywriter.    Social Determinants of Health   Financial Resource Strain: Not on file  Food  Insecurity: Not on file  Transportation Needs: Not on file  Physical Activity: Not on file  Stress: Not on file  Social Connections: Not on file     Family History: The patient's ***family history includes Alcoholism in his father; Atrial fibrillation in his brother and mother; Heart attack in his father and mother. ROS:   Please see the history of present illness.    All other systems reviewed and are negative.  EKGs/Labs/Other Studies Reviewed:    The following studies were reviewed today:  EKG:  EKG ordered today and personally reviewed.  The ekg ordered today demonstrates ***  Recent Labs: 08/31/2021: BUN 16; Creatinine, Ser 1.09; Hemoglobin 14.9; Platelets 229; Potassium 4.1; Sodium 144  Recent Lipid Panel No results found for: "CHOL", "TRIG", "HDL", "CHOLHDL", "VLDL", "LDLCALC", "LDLDIRECT"  Physical Exam:    VS:  There were no vitals taken for this visit.    Wt Readings from Last 3 Encounters:  12/21/21 232 lb (105.2 kg)  10/18/21 227 lb 9.6 oz (103.2 kg)  09/20/21 220 lb (99.8 kg)     GEN: *** Well nourished, well developed in no acute distress HEENT: Normal NECK: No JVD; No carotid bruits LYMPHATICS: No lymphadenopathy CARDIAC: ***RRR, no murmurs, rubs, gallops RESPIRATORY:  Clear to auscultation without rales, wheezing or rhonchi  ABDOMEN: Soft, non-tender, non-distended MUSCULOSKELETAL:  No edema; No deformity  SKIN: Warm and dry NEUROLOGIC:  Alert and oriented x 3 PSYCHIATRIC:  Normal affect    Signed, Shirlee More, MD  04/11/2022 6:15 PM    [facility]

## 2022-04-12 ENCOUNTER — Ambulatory Visit: Payer: Medicare Other | Attending: Cardiology | Admitting: Cardiology

## 2022-04-12 ENCOUNTER — Encounter: Payer: Self-pay | Admitting: Cardiology

## 2022-04-12 VITALS — BP 142/68 | HR 84 | Ht 72.0 in | Wt 225.4 lb

## 2022-04-12 DIAGNOSIS — I480 Paroxysmal atrial fibrillation: Secondary | ICD-10-CM | POA: Insufficient documentation

## 2022-04-12 DIAGNOSIS — Z7901 Long term (current) use of anticoagulants: Secondary | ICD-10-CM | POA: Diagnosis not present

## 2022-04-12 NOTE — Patient Instructions (Addendum)
[email or web address]        FDA-cleared, clinical grade mobile EKG monitor: Jodelle Red is the most clinically-validated mobile EKG used by the world's leading cardiac care medical professionals With Basic service, know instantly if your heart rhythm is normal or if atrial fibrillation is detected, and email the last single EKG recording to yourself or your doctor Premium service, available for purchase through the Kardia app for $9.99 per month or $99 per year, includes unlimited history and storage of your EKG recordings, a monthly EKG summary report to share with your doctor, along with the ability to track your blood pressure, activity and weight Includes one KardiaMobile phone clip FREE SHIPPING: Standard delivery 1-3 business days. Orders placed by 11:00am PST will ship that afternoon. Otherwise, will ship next business day. All orders ship via [employer] from [place], [place]    Medication Instructions:  Your physician has recommended you make the following change in your medication:  Stop Eliquis Stop Diltiazem and if BP is greater than 140 than contact Dr. Bettina Gavia for resuming this medication Start Aspirin 81 mg (coated) once daily   *If you need a refill on your cardiac medications before your next appointment, please call your pharmacy*   Lab Work: NONE If you have labs (blood work) drawn today and your tests are completely normal, you will receive your results only by: [place] (if you have MyChart) OR A paper copy in the mail If you have any lab test that is abnormal or we need to change your treatment, we will call you to review the results.   Testing/Procedures: NONE   Follow-Up: At [facility], you and your health needs are our priority.  As part of our continuing mission to provide you with exceptional heart care, we have created designated Provider Care Teams.  These Care Teams include your primary Cardiologist  (physician) and Advanced Practice Providers (APPs -  Physician Assistants and Nurse Practitioners) who all work together to provide you with the care you need, when you need it.  We recommend signing up for the patient portal called "MyChart".  Sign up information is provided on this After Visit Summary.  MyChart is used to connect with patients for Virtual Visits (Telemedicine).  Patients are able to view lab/test results, encounter notes, upcoming appointments, etc.  Non-urgent messages can be sent to your provider as well.   To learn more about what you can do with MyChart, go to [email or web address].    Your next appointment:   6 month(s)  The format for your next appointment:   In Person  Provider:   Shirlee More, MD    Other Instructions   Important Information About Sugar

## 2022-08-15 ENCOUNTER — Telehealth: Payer: Self-pay | Admitting: Cardiology

## 2022-08-15 ENCOUNTER — Encounter: Payer: Self-pay | Admitting: Cardiology

## 2022-08-15 NOTE — Telephone Encounter (Signed)
Pt is calling, he said, he forgot how to send his EKG result to Dr. Bettina Gavia. He needs assistance

## 2022-08-15 NOTE — Telephone Encounter (Signed)
Sent Kardia instructions through My Chart per pt request.

## 2022-09-01 IMAGING — CT CT HEART MORPH/PULM VEIN W/ CM & W/O CA SCORE
2 of 12 series · 6 of 20 positions shown, 7 images · IV contrast (APPLIED)
Comparison: 09/21/2011 chest radiograph.
COMPARISON: 09/21/2011 chest radiograph.

Addendum:
EXAM:
OVER-READ INTERPRETATION  CT CHEST

The following report is an over-read performed by radiologist Dr.
Euphrasia Amanyanga [REDACTED] on 09/14/2021. This over-read
does not include interpretation of cardiac or coronary anatomy or
pathology. The coronary CTA interpretation by the cardiologist is
attached.
CLINICAL DATA: Atrial fibrillation scheduled for ablation.
Cardiac CTA
TECHNIQUE: A non-contrast, gated CT scan was obtained with axial slices of 3 mm
through the heart for calcium scoring. Calcium scoring was performed
using the Agatston method. A 120 kV retrospective, gated, contrast
cardiac scan was obtained. Gantry rotation speed was 250 msecs and
collimation was 0.6 mm. Nitroglycerin was not given. A delayed scan
was obtained 60 seconds after contrast injection to exclude left
atrial appendage thrombus. The 3D dataset was reconstructed in 5%
intervals of the 0-95% of the R-R cycle. Systolic and diastolic
phases were analyzed on a dedicated workstation using MPR, MIP, and
VRT modes. The patient received 80 cc of contrast.

[Series 520: 0-90% · axial · 0.37mm/px · z∈[+1380,+1474]mm · 3 of 3770 slices shown, 4 images]
[im 943/3770  vessel]
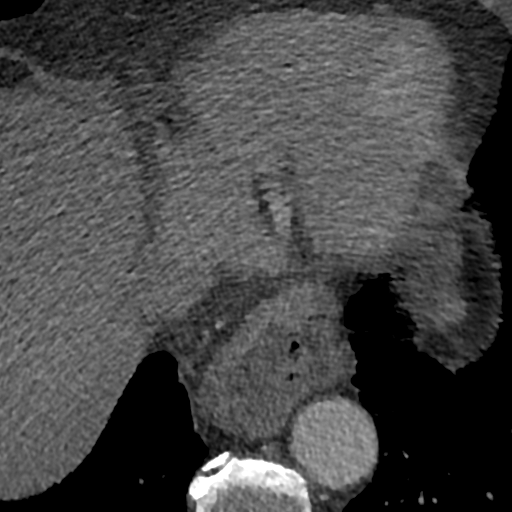
[im 943/3770  lung]
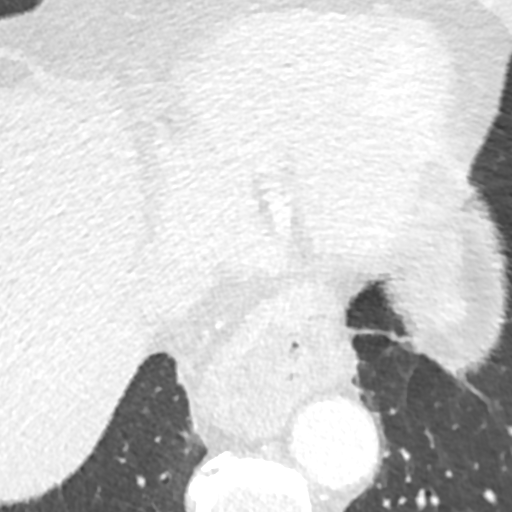
[im 1885/3770  vessel]
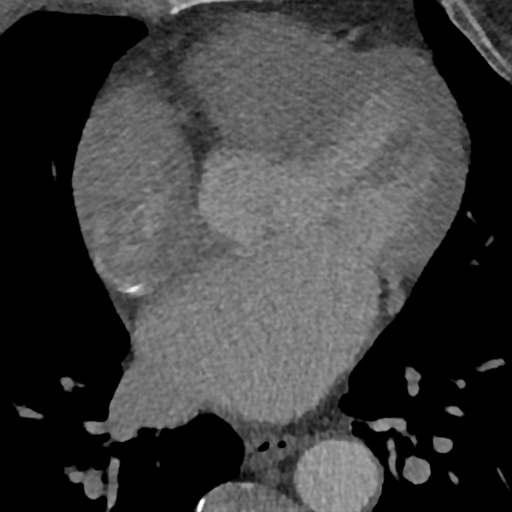
[im 2827/3770  vessel]
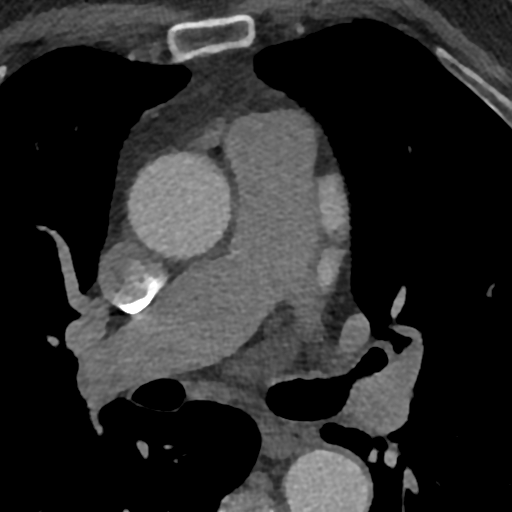

[Series 528: 5-95% · axial · 0.76mm/px · z∈[+1380,+1474]mm · 3 of 3770 slices shown]
[im 943/3770  vessel]
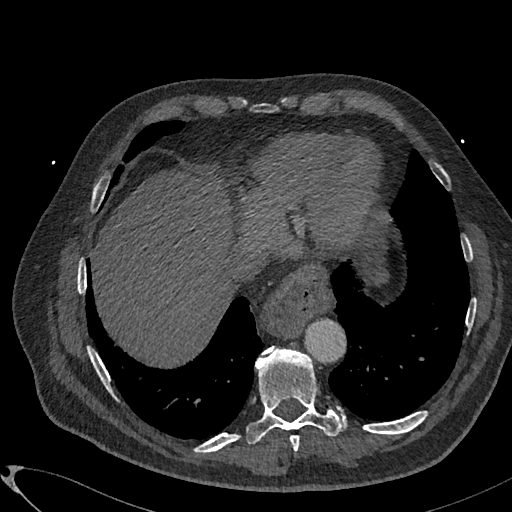
[im 1885/3770  vessel]
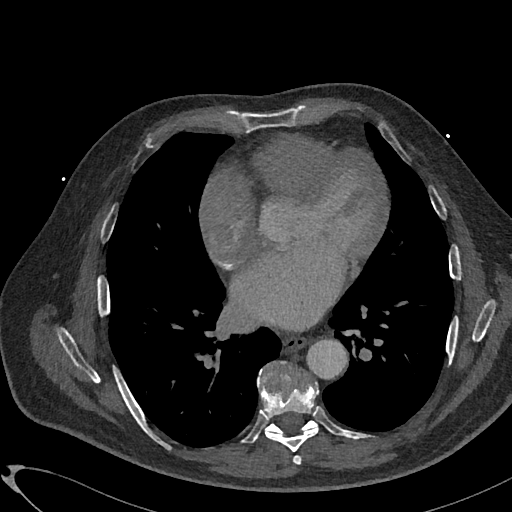
[im 2827/3770  vessel]
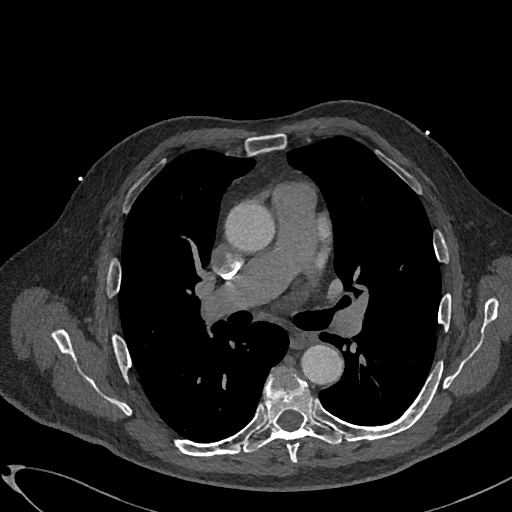

[6 of 20 positions shown; findings below may reference images not displayed]

FINDINGS: Vascular: Aortic atherosclerosis. Pulmonary arteries not well
opacified secondary to bolus timing.

Mediastinum/Nodes: No imaged thoracic adenopathy. Moderate hiatal
hernia.

Lungs/Pleura: No pleural fluid.  Clear imaged lungs.

Upper Abdomen: Normal imaged portions of the liver, spleen, right
adrenal gland.

Musculoskeletal: Probable sebaceous cyst about the anterior left
chest wall on [DATE] at 1.5 cm. At least 1 remote anterior right
rib fracture.
IMPRESSION: No acute findings in the imaged extracardiac chest.

Aortic Atherosclerosis (BAOR0-KUV.V).

Moderate hiatal hernia.
FINDINGS: Image quality: Excellent.

Noise artifact is: Limited

Pulmonary Veins: There is normal pulmonary vein drainage into the
left atrium (2 on the right and 2 on the left) with ostial
measurements as follows:

RUPV: Ostium 22 x 19 mm  area 2.94 cm2

RLPV:  Ostium 29 x 28 mm  area 6.3 cm2

LUPV:  Ostium 19 x 13 mm area 1.91 cm2

LLPV:  Ostium 22 x 20  area 3.5 cm2

Left Atrium: The left atrial size is normal. There is no LA
appendage thrombus. There is no PFO/ASD. The esophagus runs in the
left atrial midline and is not in proximity to any of the pulmonary
vein ostia.

Coronary Arteries: Not performed.

Extra-cardiac findings: See attached radiology report for
non-cardiac structures.
IMPRESSION: 1. There is normal pulmonary vein drainage into the left atrium with
ostial measurements above.

2. There is no thrombus in the left atrial appendage.

3. The esophagus runs in the left atrial midline and is not in
proximity to any of the pulmonary vein ostia.

4. No PFO/ASD.

5. Normal coronary origin. Right dominance.

*** End of Addendum ***
EXAM:
OVER-READ INTERPRETATION  CT CHEST

The following report is an over-read performed by radiologist Dr.
Euphrasia Amanyanga [REDACTED] on 09/14/2021. This over-read
does not include interpretation of cardiac or coronary anatomy or
pathology. The coronary CTA interpretation by the cardiologist is
attached.
FINDINGS: Vascular: Aortic atherosclerosis. Pulmonary arteries not well
opacified secondary to bolus timing.

Mediastinum/Nodes: No imaged thoracic adenopathy. Moderate hiatal
hernia.

Lungs/Pleura: No pleural fluid.  Clear imaged lungs.

Upper Abdomen: Normal imaged portions of the liver, spleen, right
adrenal gland.

Musculoskeletal: Probable sebaceous cyst about the anterior left
chest wall on [DATE] at 1.5 cm. At least 1 remote anterior right
rib fracture.
IMPRESSION: No acute findings in the imaged extracardiac chest.

Aortic Atherosclerosis (BAOR0-KUV.V).

Moderate hiatal hernia.

## 2022-12-15 ENCOUNTER — Encounter (INDEPENDENT_AMBULATORY_CARE_PROVIDER_SITE_OTHER): Payer: Medicare Other | Admitting: Ophthalmology

## 2023-01-03 ENCOUNTER — Encounter (INDEPENDENT_AMBULATORY_CARE_PROVIDER_SITE_OTHER): Payer: Medicare Other | Admitting: Ophthalmology

## 2023-02-02 DEATH — deceased
